# Patient Record
Sex: Male | Born: 1960 | Race: Black or African American | Hispanic: No | Marital: Single | Smoking: Current every day smoker
Health system: Southern US, Community
[De-identification: ages and names within clinical notes are randomized; demographics above are authoritative.]

## PROBLEM LIST (undated history)

## (undated) DIAGNOSIS — Z720 Tobacco use: Secondary | ICD-10-CM

## (undated) DIAGNOSIS — I10 Essential (primary) hypertension: Secondary | ICD-10-CM

## (undated) DIAGNOSIS — F209 Schizophrenia, unspecified: Secondary | ICD-10-CM

## (undated) DIAGNOSIS — B1920 Unspecified viral hepatitis C without hepatic coma: Secondary | ICD-10-CM

## (undated) DIAGNOSIS — F1910 Other psychoactive substance abuse, uncomplicated: Secondary | ICD-10-CM

## (undated) HISTORY — PX: HAND SURGERY: SHX662

## (undated) HISTORY — PX: NOSE SURGERY: SHX723

## (undated) HISTORY — PX: FOOT SURGERY: SHX648

---

## 2011-08-23 ENCOUNTER — Emergency Department (HOSPITAL_COMMUNITY)
Admission: EM | Admit: 2011-08-23 | Discharge: 2011-08-23 | Disposition: A | Discharge status: Home / Self Care | Payer: Self-pay | Attending: Emergency Medicine | Admitting: Emergency Medicine

## 2011-08-23 ENCOUNTER — Emergency Department (HOSPITAL_COMMUNITY): Payer: Self-pay

## 2011-08-23 DIAGNOSIS — G8929 Other chronic pain: Secondary | ICD-10-CM | POA: Insufficient documentation

## 2011-08-23 DIAGNOSIS — S6710XA Crushing injury of unspecified finger(s), initial encounter: Secondary | ICD-10-CM | POA: Insufficient documentation

## 2011-08-23 DIAGNOSIS — I10 Essential (primary) hypertension: Secondary | ICD-10-CM | POA: Insufficient documentation

## 2011-08-23 DIAGNOSIS — F17200 Nicotine dependence, unspecified, uncomplicated: Secondary | ICD-10-CM | POA: Insufficient documentation

## 2011-08-23 DIAGNOSIS — M79609 Pain in unspecified limb: Secondary | ICD-10-CM | POA: Insufficient documentation

## 2011-08-23 DIAGNOSIS — W230XXA Caught, crushed, jammed, or pinched between moving objects, initial encounter: Secondary | ICD-10-CM | POA: Insufficient documentation

## 2011-09-17 ENCOUNTER — Emergency Department (HOSPITAL_BASED_OUTPATIENT_CLINIC_OR_DEPARTMENT_OTHER)
Admission: EM | Admit: 2011-09-17 | Discharge: 2011-09-18 | Disposition: A | Discharge status: Rehabilitation Facility | Payer: Self-pay | Attending: Emergency Medicine | Admitting: Emergency Medicine

## 2011-09-17 ENCOUNTER — Encounter: Payer: Self-pay | Admitting: *Deleted

## 2011-09-17 DIAGNOSIS — R112 Nausea with vomiting, unspecified: Secondary | ICD-10-CM | POA: Insufficient documentation

## 2011-09-17 DIAGNOSIS — F1193 Opioid use, unspecified with withdrawal: Secondary | ICD-10-CM

## 2011-09-17 DIAGNOSIS — F17200 Nicotine dependence, unspecified, uncomplicated: Secondary | ICD-10-CM | POA: Insufficient documentation

## 2011-09-17 DIAGNOSIS — F1120 Opioid dependence, uncomplicated: Secondary | ICD-10-CM | POA: Insufficient documentation

## 2011-09-17 DIAGNOSIS — I10 Essential (primary) hypertension: Secondary | ICD-10-CM | POA: Insufficient documentation

## 2011-09-17 DIAGNOSIS — F19939 Other psychoactive substance use, unspecified with withdrawal, unspecified: Secondary | ICD-10-CM | POA: Insufficient documentation

## 2011-09-17 DIAGNOSIS — F1123 Opioid dependence with withdrawal: Secondary | ICD-10-CM

## 2011-09-17 HISTORY — DX: Other psychoactive substance abuse, uncomplicated: F19.10

## 2011-09-17 HISTORY — DX: Essential (primary) hypertension: I10

## 2011-09-17 LAB — URINALYSIS, ROUTINE W REFLEX MICROSCOPIC
Bilirubin Urine: NEGATIVE
Glucose, UA: NEGATIVE mg/dL
Hgb urine dipstick: NEGATIVE
Ketones, ur: NEGATIVE mg/dL
Nitrite: NEGATIVE
Protein, ur: 30 mg/dL — AB
Specific Gravity, Urine: 1.025 (ref 1.005–1.030)
Urobilinogen, UA: 0.2 mg/dL (ref 0.0–1.0)
pH: 7.5 (ref 5.0–8.0)

## 2011-09-17 LAB — COMPREHENSIVE METABOLIC PANEL
ALT: 61 U/L — ABNORMAL HIGH (ref 0–53)
AST: 50 U/L — ABNORMAL HIGH (ref 0–37)
Albumin: 3.8 g/dL (ref 3.5–5.2)
Alkaline Phosphatase: 77 U/L (ref 39–117)
BUN: 16 mg/dL (ref 6–23)
CO2: 25 mEq/L (ref 19–32)
Calcium: 9.6 mg/dL (ref 8.4–10.5)
Chloride: 106 mEq/L (ref 96–112)
Creatinine, Ser: 0.8 mg/dL (ref 0.50–1.35)
GFR calc Af Amer: >90 mL/min (ref >90)
GFR calc non Af Amer: >90 mL/min (ref >90)
Glucose, Bld: 105 mg/dL — ABNORMAL HIGH (ref 70–99)
Potassium: 3.8 mEq/L (ref 3.5–5.1)
Sodium: 140 mEq/L (ref 135–145)
Total Bilirubin: 0.8 mg/dL (ref 0.3–1.2)
Total Protein: 7.5 g/dL (ref 6.0–8.3)

## 2011-09-17 LAB — URINE MICROSCOPIC-ADD ON

## 2011-09-17 LAB — CBC
HCT: 49.4 % (ref 39.0–52.0)
Hemoglobin: 17.3 g/dL — ABNORMAL HIGH (ref 13.0–17.0)
MCH: 29.6 pg (ref 26.0–34.0)
MCHC: 35 g/dL (ref 30.0–36.0)
MCV: 84.6 fL (ref 78.0–100.0)
Platelets: 211 10*3/uL (ref 150–400)
RBC: 5.84 MIL/uL — ABNORMAL HIGH (ref 4.22–5.81)
RDW: 13.3 % (ref 11.5–15.5)
WBC: 5.9 10*3/uL (ref 4.0–10.5)

## 2011-09-17 LAB — RAPID URINE DRUG SCREEN, HOSP PERFORMED
Amphetamines: NOT DETECTED
Barbiturates: NOT DETECTED
Benzodiazepines: NOT DETECTED
Cocaine: NOT DETECTED
Opiates: POSITIVE — AB
Tetrahydrocannabinol: NOT DETECTED

## 2011-09-17 LAB — ETHANOL: Alcohol, Ethyl (B): <11 mg/dL (ref 0–11)

## 2011-09-17 LAB — ACETAMINOPHEN LEVEL: Acetaminophen (Tylenol), Serum: <15 ug/mL (ref 10–30)

## 2011-09-17 MED ORDER — IBUPROFEN 800 MG PO TABS
800.0000 mg | ORAL_TABLET | Freq: Once | ORAL | Status: AC
  Administered 2011-09-17: 800 mg via ORAL
  Filled 2011-09-17: qty 1

## 2011-09-17 MED ORDER — ONDANSETRON 8 MG PO TBDP
ORAL_TABLET | ORAL | Status: AC
  Filled 2011-09-17: qty 1

## 2011-09-17 MED ORDER — OXYCODONE-ACETAMINOPHEN 5-325 MG PO TABS
2.0000 | ORAL_TABLET | Freq: Once | ORAL | Status: AC
  Administered 2011-09-17: 2 via ORAL
  Filled 2011-09-17: qty 2

## 2011-09-17 MED ORDER — DIAZEPAM 5 MG PO TABS
5.0000 mg | ORAL_TABLET | Freq: Once | ORAL | Status: AC
  Administered 2011-09-17: 5 mg via ORAL
  Filled 2011-09-17: qty 1

## 2011-09-17 MED ORDER — ENALAPRIL MALEATE 10 MG PO TABS
10.0000 mg | ORAL_TABLET | Freq: Once | ORAL | Status: AC
  Administered 2011-09-17: 10 mg via ORAL
  Filled 2011-09-17: qty 1

## 2011-09-17 MED ORDER — ONDANSETRON 8 MG PO TBDP
8.0000 mg | ORAL_TABLET | Freq: Once | ORAL | Status: AC
  Administered 2011-09-17: 8 mg via ORAL

## 2011-09-17 MED ORDER — ONDANSETRON 8 MG PO TBDP
ORAL_TABLET | ORAL | Status: AC
  Administered 2011-09-17: 8 mg via ORAL
  Filled 2011-09-17: qty 1

## 2011-09-17 MED ORDER — CLONIDINE HCL 0.1 MG PO TABS
0.1000 mg | ORAL_TABLET | Freq: Once | ORAL | Status: AC
  Administered 2011-09-17: 0.1 mg via ORAL
  Filled 2011-09-17: qty 1

## 2011-09-17 MED ORDER — ENALAPRIL MALEATE 10 MG PO TABS: 10.0000 mg | ORAL_TABLET | Freq: Every day | ORAL | Status: DC

## 2011-09-17 NOTE — ED Notes (Signed)
Spoke with [place] at [facility] was told that pt needed a 14-30 supply of medication before he "may be accepted" as well as 4 consecutive normal BP over a 4 hour time span. Unable to facilitate transfer at this time [facility] notified to help with placement

## 2011-09-17 NOTE — ED Provider Notes (Signed)
History  Scribed for Raeford Razor, MD, the patient was seen in MH06/MH06. The chart was scribed by Gilman Schmidt. The patients care was started at 3:20 PM. CSN: [id number] Arrival date & time: 09/17/2011  2:10 PM   First MD Initiated Contact with Patient 09/17/11 1453      Chief Complaint  Patient presents with  . Medical Clearance    HPI Frederick [name] is a 50 y.o. male who presents to the Emergency Department complaining of medical clearance. 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 Last use was Thursday morning (two days ago). Pt states he is accepted by [facility] and needs medical clearance. States he has quit before. Denies any thoughts of harming self or others. Denies any other substance abuse. Additionally notes that he has not eaten since Thursday. Associated symptoms of N/V/D, back pain, chills but states that these are typical withdrawal symptoms. Also notes, pain and swelling in normal injection site (hands). Pt states he should have been taking blood pressure meds but is unable to get Rx due to not having a PCP. There are no other associated symptoms and no other alleviating or aggravating factors.     Past Medical History  Diagnosis Date  . Drug abuse   . Hypertension     Past Surgical History  Procedure Date  . Nose surgery   . Hand surgery   . Foot surgery     No family history on file.  History  Substance Use Topics  . Smoking status: Current Everyday Smoker -- 0.5 packs/day  . Smokeless tobacco: Never Used  . Alcohol Use: Yes     last drink 6 months ago      Review of Systems  Constitutional: Negative for appetite change.  HENT: Positive for neck pain.   Gastrointestinal: Positive for nausea, vomiting and diarrhea.  Musculoskeletal: Positive for back pain.  Neurological: Negative for dizziness and headaches.  Psychiatric/Behavioral: Negative for suicidal ideas and self-injury.  All other systems reviewed and are  negative.    Allergies  Review of patient's allergies indicates no known allergies.  Home Medications  No current outpatient prescriptions on file.  BP 182/110  Pulse 72  Temp(Src) 97.9 F (36.6 C) (Oral)  Resp 18  Ht 6\' 2"  (1.88 m)  Wt 200 lb (90.719 kg)  BMI 25.68 kg/m2  SpO2 99%  Physical Exam  Constitutional: He is oriented to person, place, and time. He appears well-developed and well-nourished.  Non-toxic appearance. He does not have a sickly appearance.  HENT:  Head: Normocephalic and atraumatic.  Eyes: Conjunctivae, EOM and lids are normal. Pupils are equal, round, and reactive to light.  Neck: Trachea normal, normal range of motion and full passive range of motion without pain. Neck supple.  Cardiovascular: Regular rhythm and normal heart sounds.   Pulmonary/Chest: Effort normal and breath sounds normal. No respiratory distress.  Abdominal: Soft. Normal appearance. He exhibits no distension. There is no tenderness. There is no rebound and no CVA tenderness.  Musculoskeletal: Normal range of motion.  Neurological: He is alert and oriented to person, place, and time. He has normal strength. No cranial nerve deficit. Coordination normal.  Skin: Skin is warm, dry and intact. No rash noted.    ED Course  Procedures  DIAGNOSTIC STUDIES: Oxygen Saturation is 99% on room air, normal by my interpretation.    COORDINATION OF CARE: 3:20:  - Patient evaluated by ED physician, Advil, Valium, Catapres, Zofran, labs ordered  Results for orders placed during the hospital encounter of 09/17/11  URINALYSIS, ROUTINE W REFLEX MICROSCOPIC      Component Value Range   Color, Urine AMBER (*) YELLOW    Appearance CLEAR  CLEAR    Specific Gravity, Urine 1.025  1.005 - 1.030    pH 7.5  5.0 - 8.0    Glucose, UA NEGATIVE  NEGATIVE (mg/dL)   Hgb urine dipstick NEGATIVE  NEGATIVE    Bilirubin Urine NEGATIVE  NEGATIVE    Ketones, ur NEGATIVE  NEGATIVE (mg/dL)   Protein, ur 30 (*)  NEGATIVE (mg/dL)   Urobilinogen, UA 0.2  0.0 - 1.0 (mg/dL)   Nitrite NEGATIVE  NEGATIVE    Leukocytes, UA TRACE (*) NEGATIVE   URINE RAPID DRUG SCREEN (HOSP PERFORMED)      Component Value Range   Opiates POSITIVE (*) NONE DETECTED    Cocaine NONE DETECTED  NONE DETECTED    Benzodiazepines NONE DETECTED  NONE DETECTED    Amphetamines NONE DETECTED  NONE DETECTED    Tetrahydrocannabinol NONE DETECTED  NONE DETECTED    Barbiturates NONE DETECTED  NONE DETECTED   CBC      Component Value Range   WBC 5.9  4.0 - 10.5 (K/uL)   RBC 5.84 (*) 4.22 - 5.81 (MIL/uL)   Hemoglobin 17.3 (*) 13.0 - 17.0 (g/dL)   HCT 45.4  09.8 - 11.9 (%)   MCV 84.6  78.0 - 100.0 (fL)   MCH 29.6  26.0 - 34.0 (pg)   MCHC 35.0  30.0 - 36.0 (g/dL)   RDW 14.7  82.9 - 56.2 (%)   Platelets 211  150 - 400 (K/uL)  COMPREHENSIVE METABOLIC PANEL      Component Value Range   Sodium 140  135 - 145 (mEq/L)   Potassium 3.8  3.5 - 5.1 (mEq/L)   Chloride 106  96 - 112 (mEq/L)   CO2 25  19 - 32 (mEq/L)   Glucose, Bld 105 (*) 70 - 99 (mg/dL)   BUN 16  6 - 23 (mg/dL)   Creatinine, Ser 1.30  0.50 - 1.35 (mg/dL)   Calcium 9.6  8.4 - 86.5 (mg/dL)   Total Protein 7.5  6.0 - 8.3 (g/dL)   Albumin 3.8  3.5 - 5.2 (g/dL)   AST 50 (*) 0 - 37 (U/L)   ALT 61 (*) 0 - 53 (U/L)   Alkaline Phosphatase 77  39 - 117 (U/L)   Total Bilirubin 0.8  0.3 - 1.2 (mg/dL)   GFR calc non Af Amer >90  >90 (mL/min)   GFR calc Af Amer >90  >90 (mL/min)  ETHANOL      Component Value Range   Alcohol, Ethyl (B) <11  0 - 11 (mg/dL)  ACETAMINOPHEN LEVEL      Component Value Range   Acetaminophen (Tylenol), Serum <15.0  10 - 30 (ug/mL)  URINE MICROSCOPIC-ADD ON      Component Value Range   Squamous Epithelial / LPF RARE  RARE    WBC, UA 3-6  <3 (WBC/hpf)   RBC / HPF 3-6  <3 (RBC/hpf)   Bacteria, UA MANY (*) RARE    Urine-Other MUCOUS PRESENT          MDM  50yM with heroin withdrawal. Last used 2d ago. Attributes symptoms to withdrawal and  similar to previous. Nontoxic.  Pt with place at [facility] but were requesting clinical clearance prior. Pt medically cleared from my perspective. [facility] requesting better BP control. Restarted on vasotec which  pt has been previously prescribed. Suspect HTN from poor compliance with meds and likely component of withdrawal. PRN clonidine as well. No SI/HI or psychosis.  I personally preformed the services scribed in my presence. The recorded information has been reviewed and considered. Raeford Razor, MD.        Raeford Razor, MD 09/18/11 [phone]

## 2011-09-17 NOTE — ED Notes (Signed)
[facility] here to assess pt

## 2011-09-17 NOTE — ED Notes (Signed)
Pt states he is accepted by [facility]  For detox- needs medical clearance and they will pick him up- denies SI/HI

## 2011-09-17 NOTE — ED Notes (Signed)
Pt reports he is detoxing from heroin- last use Thursday am

## 2011-09-17 NOTE — ED Notes (Signed)
Pt requesting PO fluids. Pt given gingerale , delay explained regarding [facility] , will continue to monitor

## 2011-09-17 NOTE — ED Notes (Signed)
Patient given ginger ale and crackers with cheese. He is resting comfortably with no additiona complaints. Patient instructed to call for further needs.

## 2011-09-17 NOTE — ED Notes (Signed)
[facility] representative will fill a 15 day supply of medication for pt. RX called in to preferred pharmacy   Awaiting their arrival for assessment

## 2011-09-17 NOTE — ED Notes (Signed)
Spoke with [facility] they are coming to assess pt and find a suitable program for pt to enter

## 2011-09-17 NOTE — ED Notes (Signed)
Unable to obtain blood for testing. Assigned RN made aware

## 2011-12-04 ENCOUNTER — Encounter (HOSPITAL_BASED_OUTPATIENT_CLINIC_OR_DEPARTMENT_OTHER): Payer: Self-pay | Admitting: *Deleted

## 2011-12-04 ENCOUNTER — Emergency Department (HOSPITAL_BASED_OUTPATIENT_CLINIC_OR_DEPARTMENT_OTHER)
Admission: EM | Admit: 2011-12-04 | Discharge: 2011-12-04 | Disposition: A | Discharge status: Home / Self Care | Payer: Self-pay | Attending: Emergency Medicine | Admitting: Emergency Medicine

## 2011-12-04 DIAGNOSIS — L03319 Cellulitis of trunk, unspecified: Secondary | ICD-10-CM | POA: Insufficient documentation

## 2011-12-04 DIAGNOSIS — IMO0002 Reserved for concepts with insufficient information to code with codable children: Secondary | ICD-10-CM

## 2011-12-04 DIAGNOSIS — L02219 Cutaneous abscess of trunk, unspecified: Secondary | ICD-10-CM | POA: Insufficient documentation

## 2011-12-04 MED ORDER — CLINDAMYCIN HCL 300 MG PO CAPS: 300.0000 mg | ORAL_CAPSULE | Freq: Three times a day (TID) | ORAL | Status: AC

## 2011-12-04 NOTE — ED Notes (Signed)
Pt states he has had a place on his abd for 3-4 weeks. "Tried home remedy, drained green drainage". Now feels sore and tight again.

## 2011-12-04 NOTE — ED Provider Notes (Signed)
Medical screening examination/treatment/procedure(s) were performed by non-physician practitioner and as supervising physician I was immediately available for consultation/collaboration.   Nat Christen, MD 12/04/11 2139

## 2011-12-04 NOTE — ED Provider Notes (Signed)
History     CSN: [id number]  Arrival date & time 12/04/11  1858   First MD Initiated Contact with Patient 12/04/11 1915      Chief Complaint  Patient presents with  . Abscess    (Consider location/radiation/quality/duration/timing/severity/associated sxs/prior treatment) HPI Comments: Pt states that he has tried home remedies, and it started to drain, but it doesn't seem to be getting any better  Patient is a 51 y.o. male presenting with abscess. The history is provided by the patient. No language interpreter was used.  Abscess  This is a new problem. The current episode started more than one week ago. The abscess is present on the abdomen. The abscess is characterized by redness, painfulness and draining. Pertinent negatives include no fever. There were no sick contacts. He has received no recent medical care.    Past Medical History  Diagnosis Date  . Drug abuse   . Hypertension     Past Surgical History  Procedure Date  . Nose surgery   . Hand surgery   . Foot surgery     History reviewed. No pertinent family history.  History  Substance Use Topics  . Smoking status: Current Everyday Smoker -- 0.5 packs/day  . Smokeless tobacco: Never Used  . Alcohol Use: Yes     last drink 6 months ago      Review of Systems  Constitutional: Negative for fever.  All other systems reviewed and are negative.    Allergies  Review of patient's allergies indicates no known allergies.  Home Medications  No current outpatient prescriptions on file.  BP 171/102  Pulse 85  Temp(Src) 98.6 F (37 C) (Oral)  Resp 19  Ht 6\' 2"  (1.88 m)  Wt 220 lb (99.791 kg)  BMI 28.25 kg/m2  SpO2 98%  Physical Exam  Nursing note and vitals reviewed. Constitutional: He is oriented to person, place, and time. He appears well-developed and well-nourished.  HENT:  Head: Normocephalic and atraumatic.  Cardiovascular: Normal rate and regular rhythm.   Pulmonary/Chest: Effort normal and  breath sounds normal.  Abdominal: Soft.  Musculoskeletal: Normal range of motion.  Neurological: He is alert and oriented to person, place, and time.  Skin:       Pt has red area to the right lower abdomen with firm area noted to the center with some yellow drainage noted    ED Course  INCISION AND DRAINAGE Performed by: Teressa Lower Authorized by: Teressa Lower Consent: Verbal consent obtained. Written consent not obtained. Risks and benefits: risks, benefits and alternatives were discussed Consent given by: patient Patient understanding: patient states understanding of the procedure being performed Patient identity confirmed: verbally with patient Time out: Immediately prior to procedure a "time out" was called to verify the correct patient, procedure, equipment, support staff and site/side marked as required. Type: abscess Body area: trunk Location details: abdomen Anesthesia: local infiltration Local anesthetic: lidocaine 2% with epinephrine Scalpel size: 11 Incision type: single straight Drainage: purulent Drainage amount: scant Packing material: 1/4 in iodoform gauze Patient tolerance: Patient tolerated the procedure well with no immediate complications.   (including critical care time)  Labs Reviewed - No data to display No results found.   1. Abscess, abdomen       MDM  Wound opened, pt treated for abscess with some cellulitis component;discussed bp with pt        Teressa Lower, NP 12/04/11 2025  Teressa Lower, NP 12/04/11 2025

## 2011-12-04 NOTE — ED Notes (Signed)
I cleaned area around drainage incision and advised patient on keeping dressing clean and dry. I applied 2x2's and covered with large tegaderm film.

## 2014-03-19 ENCOUNTER — Encounter: Payer: Self-pay | Admitting: Family Medicine

## 2014-03-19 ENCOUNTER — Telehealth: Payer: Self-pay | Admitting: Family Medicine

## 2014-03-19 NOTE — Telephone Encounter (Signed)
We received a letter from pt wanting us to review his medical records [facility].  Dr. Susann GivensLalonde reviewed the letter and advised pt needs to have his lawyer handle this through their medical experts.  Letter sent to patient.

## 2015-09-19 ENCOUNTER — Emergency Department (HOSPITAL_COMMUNITY)
Admission: EM | Admit: 2015-09-19 | Discharge: 2015-09-20 | Disposition: A | Discharge status: Home / Self Care | Payer: Self-pay | Attending: Emergency Medicine | Admitting: Emergency Medicine

## 2015-09-19 ENCOUNTER — Encounter (HOSPITAL_COMMUNITY): Payer: Self-pay | Admitting: *Deleted

## 2015-09-19 DIAGNOSIS — F1414 Cocaine abuse with cocaine-induced mood disorder: Secondary | ICD-10-CM | POA: Insufficient documentation

## 2015-09-19 DIAGNOSIS — I10 Essential (primary) hypertension: Secondary | ICD-10-CM | POA: Insufficient documentation

## 2015-09-19 DIAGNOSIS — Z79899 Other long term (current) drug therapy: Secondary | ICD-10-CM | POA: Insufficient documentation

## 2015-09-19 DIAGNOSIS — R440 Auditory hallucinations: Secondary | ICD-10-CM | POA: Insufficient documentation

## 2015-09-19 DIAGNOSIS — Z720 Tobacco use: Secondary | ICD-10-CM | POA: Insufficient documentation

## 2015-09-19 DIAGNOSIS — F1114 Opioid abuse with opioid-induced mood disorder: Secondary | ICD-10-CM | POA: Insufficient documentation

## 2015-09-19 DIAGNOSIS — R441 Visual hallucinations: Secondary | ICD-10-CM | POA: Insufficient documentation

## 2015-09-19 DIAGNOSIS — B1920 Unspecified viral hepatitis C without hepatic coma: Secondary | ICD-10-CM | POA: Clinically undetermined

## 2015-09-19 DIAGNOSIS — R45851 Suicidal ideations: Secondary | ICD-10-CM | POA: Insufficient documentation

## 2015-09-19 DIAGNOSIS — F1994 Other psychoactive substance use, unspecified with psychoactive substance-induced mood disorder: Secondary | ICD-10-CM | POA: Diagnosis present

## 2015-09-19 HISTORY — DX: Schizophrenia, unspecified: F20.9

## 2015-09-19 LAB — COMPREHENSIVE METABOLIC PANEL
ALBUMIN: 4.4 g/dL (ref 3.5–5.0)
ALK PHOS: 85 U/L (ref 38–126)
ALT: 33 U/L (ref 17–63)
AST: 44 U/L — ABNORMAL HIGH (ref 15–41)
Anion gap: 8 (ref 5–15)
BILIRUBIN TOTAL: 1.6 mg/dL — AB (ref 0.3–1.2)
BUN: 19 mg/dL (ref 6–20)
CALCIUM: 9.2 mg/dL (ref 8.9–10.3)
CO2: 27 mmol/L (ref 22–32)
CREATININE: 1.18 mg/dL (ref 0.61–1.24)
Chloride: 105 mmol/L (ref 101–111)
GFR calc Af Amer: >60 mL/min (ref >60)
GFR calc non Af Amer: >60 mL/min (ref >60)
GLUCOSE: 99 mg/dL (ref 65–99)
Potassium: 4.1 mmol/L (ref 3.5–5.1)
SODIUM: 140 mmol/L (ref 135–145)
Total Protein: 7.8 g/dL (ref 6.5–8.1)

## 2015-09-19 LAB — CBC
HEMATOCRIT: 48.1 % (ref 39.0–52.0)
HEMOGLOBIN: 16.8 g/dL (ref 13.0–17.0)
MCH: 31.2 pg (ref 26.0–34.0)
MCHC: 34.9 g/dL (ref 30.0–36.0)
MCV: 89.4 fL (ref 78.0–100.0)
Platelets: 165 10*3/uL (ref 150–400)
RBC: 5.38 MIL/uL (ref 4.22–5.81)
RDW: 12.6 % (ref 11.5–15.5)
WBC: 7.6 10*3/uL (ref 4.0–10.5)

## 2015-09-19 LAB — ACETAMINOPHEN LEVEL: Acetaminophen (Tylenol), Serum: <10 ug/mL — ABNORMAL LOW (ref 10–30)

## 2015-09-19 LAB — RAPID URINE DRUG SCREEN, HOSP PERFORMED
Amphetamines: NOT DETECTED
BARBITURATES: NOT DETECTED
Benzodiazepines: NOT DETECTED
COCAINE: POSITIVE — AB
Opiates: POSITIVE — AB
TETRAHYDROCANNABINOL: NOT DETECTED

## 2015-09-19 LAB — ETHANOL: Alcohol, Ethyl (B): <5 mg/dL (ref <5)

## 2015-09-19 LAB — SALICYLATE LEVEL: Salicylate Lvl: <4 mg/dL (ref 2.8–30.0)

## 2015-09-19 MED ORDER — GABAPENTIN 400 MG PO CAPS
1200.0000 mg | ORAL_CAPSULE | Freq: Three times a day (TID) | ORAL | Status: DC
  Administered 2015-09-19 – 2015-09-20 (×3): 1200 mg via ORAL
  Filled 2015-09-19 (×3): qty 3

## 2015-09-19 MED ORDER — DICYCLOMINE HCL 20 MG PO TABS: 20.0000 mg | ORAL_TABLET | Freq: Four times a day (QID) | ORAL | Status: DC | PRN

## 2015-09-19 MED ORDER — METHOCARBAMOL 500 MG PO TABS: 500.0000 mg | ORAL_TABLET | Freq: Three times a day (TID) | ORAL | Status: DC | PRN

## 2015-09-19 MED ORDER — LORAZEPAM 1 MG PO TABS
1.0000 mg | ORAL_TABLET | Freq: Three times a day (TID) | ORAL | Status: DC | PRN
  Administered 2015-09-19: 1 mg via ORAL
  Filled 2015-09-19: qty 1

## 2015-09-19 MED ORDER — CLONIDINE HCL 0.1 MG PO TABS
0.1000 mg | ORAL_TABLET | Freq: Four times a day (QID) | ORAL | Status: DC
  Administered 2015-09-20: 0.1 mg via ORAL
  Filled 2015-09-19 (×2): qty 1

## 2015-09-19 MED ORDER — LOPERAMIDE HCL 2 MG PO CAPS: 2.0000 mg | ORAL_CAPSULE | ORAL | Status: DC | PRN

## 2015-09-19 MED ORDER — ACETAMINOPHEN 325 MG PO TABS: 650.0000 mg | ORAL_TABLET | ORAL | Status: DC | PRN

## 2015-09-19 MED ORDER — NICOTINE 21 MG/24HR TD PT24: 21.0000 mg | MEDICATED_PATCH | Freq: Every day | TRANSDERMAL | Status: DC | PRN

## 2015-09-19 MED ORDER — CLONIDINE HCL 0.1 MG PO TABS: 0.1000 mg | ORAL_TABLET | Freq: Every day | ORAL | Status: DC

## 2015-09-19 MED ORDER — ENALAPRIL MALEATE 20 MG PO TABS
20.0000 mg | ORAL_TABLET | Freq: Every day | ORAL | Status: DC
  Administered 2015-09-19 – 2015-09-20 (×2): 20 mg via ORAL
  Filled 2015-09-19 (×2): qty 1

## 2015-09-19 MED ORDER — CLONIDINE HCL 0.1 MG PO TABS: 0.1000 mg | ORAL_TABLET | ORAL | Status: DC

## 2015-09-19 MED ORDER — AMLODIPINE BESYLATE 10 MG PO TABS
10.0000 mg | ORAL_TABLET | Freq: Every day | ORAL | Status: DC
  Administered 2015-09-19 – 2015-09-20 (×2): 10 mg via ORAL
  Filled 2015-09-19 (×2): qty 1

## 2015-09-19 MED ORDER — HYDROXYZINE HCL 25 MG PO TABS: 25.0000 mg | ORAL_TABLET | Freq: Four times a day (QID) | ORAL | Status: DC | PRN

## 2015-09-19 MED ORDER — ONDANSETRON 4 MG PO TBDP: 4.0000 mg | ORAL_TABLET | Freq: Four times a day (QID) | ORAL | Status: DC | PRN

## 2015-09-19 MED ORDER — NAPROXEN 500 MG PO TABS: 500.0000 mg | ORAL_TABLET | Freq: Two times a day (BID) | ORAL | Status: DC | PRN

## 2015-09-19 MED ORDER — HYDROCHLOROTHIAZIDE 25 MG PO TABS
25.0000 mg | ORAL_TABLET | Freq: Every day | ORAL | Status: DC
  Administered 2015-09-19: 25 mg via ORAL
  Filled 2015-09-19 (×2): qty 1

## 2015-09-19 NOTE — ED Notes (Signed)
[employer] told rn that pt keeps putting his hand down his pants, rn went in and told pt he was not allowed to have his hands in his pants while pharmacy is talking to him. Pt said he was scratching right right leg above his knee. Pt pulled his pant leg all the way up and exposed his hand, rn told pt take your hand out of your pants and scratch you leg with your pant leg pulled up.

## 2015-09-19 NOTE — BH Assessment (Signed)
Tele Assessment Note   Frederick [name] is an 54 y.o. male. Per chart review, pt BIB police after police called b/c pt was "jumping in and out of traffic".  Pt is drowsy and falls asleep several times during assessment. Pt denies he was walking into traffic today in a suicide attempt. He reports that he was chatting with the police and talking to the police about the need to vocational rehab. Pt says that he does well for a while with his life and then starts abusing heroin again. [facility]. Pt denies prior suicide attempts. Pt reports hx of schizophrenia. He reports he uses heroin and used 3 grams this am. Pt doesn't respond when asked how he ingests the heroin. [facility]. 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   Diagnosis:  Opioid Use Disorder, Severe Schizophrenia    Past Medical History:  Past Medical History  Diagnosis Date  . Drug abuse     heroin  . Hypertension   . Schizophrenia [facility](HCC)     Past Surgical History  Procedure Laterality Date  . Nose surgery    . Hand surgery    . Foot surgery      Family History: History reviewed. No pertinent family history.  Social History:  reports that he has been smoking.  He has never used smokeless tobacco. He reports that he drinks alcohol. He reports that he uses illicit drugs.  Additional Social History:  Alcohol / Drug Use Pain Medications: pt denies abuse - see PTA meds list Prescriptions: pt denies abuse = see PTA meds list Over the Counter: pt denies abuse - see PTA meds list History of alcohol / drug use?: Yes Substance #1 Name of Substance 1: heroin 1 - Last Use / Amount: 09/19/15 - 3 grams  CIWA: CIWA-Ar BP: 129/81 mmHg Pulse Rate: 75 COWS:    PATIENT STRENGTHS: (choose at least two) Average or above average intelligence Communication skills  Allergies: No Known Allergies  Home Medications:   (Not in a hospital admission)  OB/GYN Status:  No LMP for male patient.  General Assessment Data Location of Assessment: [facility] TTS Assessment: In system Is this a Tele or Face-to-Face Assessment?: Face-to-Face Is this an Initial Assessment or a Re-assessment for this encounter?: Initial Assessment Living Arrangements: Other (Comment) (homeless, on the streets) Can pt return to current living arrangement?: Yes Admission Status: Voluntary Is patient capable of signing voluntary admission?: Yes Referral Source: Self/Family/Friend Insurance type: self pay     Crisis Care Plan Living Arrangements: Other (Comment) (homeless, on the streets) Name of Psychiatrist: none Name of Therapist: none  Education Status Is patient currently in school?: No Highest grade of school patient has completed: 13  Risk to self with the past 6 months Suicidal Ideation: No Has patient been a risk to self within the past 6 months prior to admission? : No Suicidal Intent: No Has patient had any suicidal intent within the past 6 months prior to admission? : No Is patient at risk for suicide?: Yes Suicidal Plan?: No Has patient had any suicidal plan within the past 6 months prior to admission? : No Access to Means: Yes Specify Access to Suicidal Means: na What has been your use of drugs/alcohol within the last 12 months?: pt reports heroin abuse Previous Attempts/Gestures: No How many times?: 0 (pt denies he tried to walk into traffic) Other Self  Harm Risks: none Triggers for Past Attempts:  (n/a) Intentional Self Injurious Behavior: None Family Suicide History: Unknown Recent stressful life event(s): Other (Comment) (heroin abuse) Persecutory voices/beliefs?: No Depression: No Depression Symptoms:  (none) Substance abuse history and/or treatment for substance abuse?: Yes Suicide prevention information given to non-admitted patients: Not applicable  Risk to Others within the past 6  months Homicidal Ideation: No Does patient have any lifetime risk of violence toward others beyond the six months prior to admission? : No Thoughts of Harm to Others: No Current Homicidal Intent: No Current Homicidal Plan: No Access to Homicidal Means: No Identified Victim: none History of harm to others?: No Assessment of Violence: None Noted Violent Behavior Description: pt denies hx of abuse Does patient have access to weapons?: No Criminal Charges Pending?: Yes Describe Pending Criminal Charges: reckless driving to endanger Does patient have a court date: Yes Court Date: 09/29/15 Is patient on probation?: Unknown  Psychosis Hallucinations: Auditory, Visual Delusions: None noted  Mental Status Report Appearance/Hygiene: In scrubs, Disheveled Eye Contact: Fair Motor Activity: Freedom of movement, Psychomotor retardation Speech: Logical/coherent, Slow Level of Consciousness: Drowsy, Sleeping Mood: Euthymic Affect: Other (Comment) (sleepy) Anxiety Level: None Thought Processes: Relevant, Coherent Judgement: Impaired Orientation: Person, Place, Time Obsessive Compulsive Thoughts/Behaviors: None  Cognitive Functioning Concentration: Normal Memory: Recent Intact, Remote Intact IQ: Average Insight: Fair Impulse Control: Poor Appetite: Fair Sleep: Decreased Total Hours of Sleep: 2 Vegetative Symptoms: None  ADLScreening [facility] Assessment Services) Patient's cognitive ability adequate to safely complete daily activities?: Yes Patient able to express need for assistance with ADLs?: Yes Independently performs ADLs?: Yes (appropriate for developmental age)  Prior Inpatient Therapy Prior Inpatient Therapy: Yes Prior Therapy Dates: over the past few years Prior Therapy Facilty/Provider(s): [facility] Reason for Treatment: substance abuse  Prior Outpatient Therapy Prior Outpatient Therapy: Yes Does patient have an ACCT team?: No Does patient have Intensive  In-House Services?  : No  ADL Screening (condition at time of admission) Patient's cognitive ability adequate to safely complete daily activities?: Yes Is the patient deaf or have difficulty hearing?: No Does the patient have difficulty seeing, even when wearing glasses/contacts?: No Does the patient have difficulty concentrating, remembering, or making decisions?: No Patient able to express need for assistance with ADLs?: Yes Does the patient have difficulty dressing or bathing?: No Independently performs ADLs?: Yes (appropriate for developmental age) Does the patient have difficulty walking or climbing stairs?: No Weakness of Legs: None Weakness of Arms/Hands: None  Home Assistive Devices/Equipment Home Assistive Devices/Equipment: None    Abuse/Neglect Assessment (Assessment to be complete while patient is alone) Physical Abuse: Denies Verbal Abuse: Denies Sexual Abuse: Denies Exploitation of patient/patient's resources: Denies Self-Neglect: Denies     Merchant navy officer (For Healthcare) Does patient have an advance directive?: No    Additional Information 1:1 In Past 12 Months?: No CIRT Risk: No Elopement Risk: No Does patient have medical clearance?: No     Disposition:   Alberteen Sam NP recommends pt be kept in ED overnight to observation with psych evaluation 10/29 am.  Disposition Initial Assessment Completed for this Encounter: Yes Disposition of Patient: Other dispositions Other disposition(s):  (observe overnight and psych consult )  Aneesa Romey P 09/19/2015 4:34 PM

## 2015-09-19 NOTE — ED Notes (Signed)
Pt extremely slow to change clothes, pt just sitting in bathroom when rn checked on him. rn had to coach pt through changing his clothes. Pt has calluses/wounds to bottom on feet.

## 2015-09-19 NOTE — ED Notes (Signed)
Pt AAO x 3, no distress noted, watching TV at present.  Monitoring for safety, Q 15 min checks in effect.

## 2015-09-19 NOTE — ED Notes (Signed)
Bed: WBH41 Expected date:  Expected time:  Means of arrival:  Comments: Triage 4 

## 2015-09-19 NOTE — ED Notes (Signed)
Per magistrate IVC papers were not received even though we received confirmation of fax

## 2015-09-19 NOTE — ED Notes (Signed)
After collecting pt's blood, pt starting making weird noises and put his hand in his scrub pants and started what appeared to be masturbating.  I notified edp and triage nurse.

## 2015-09-19 NOTE — ED Provider Notes (Signed)
Informed by  Nursing that patient wanted to leave. Psychiatry team had already seen him and recommended admission so patient will be IVC'ed to ensure he receives care that is appropriate.   Marily MemosJason Tempest Frankland, MD 09/19/15 2009
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
## 2015-09-19 NOTE — Progress Notes (Signed)
Pt is a 54 y/o AAM admitted to [facility] with h/o Schizophrenia and medication noncompliance X months in conjunction with daily heroin use per nursing report. [facility] reportedly "walking in and out of traffic" in a suicide attempt which he denied. Pt ambulated to unit with assigned ED tech and gait was unsteady at the time. Pt was sedated on initial contact, could not stay awake to complete assessment, required multiple prompts in between questions. Pt denied SI, HI, AVH and pain at time of assessment. However, he did stated to writer that he hears voices sometimes but "not right now". Pt slept, woke up and is much clearer in his speech with steady gait compared to initial presentation. Pt was started on CIWA with Clonidine Protocol but refused initial dose when offered by writer and is demanding d/c at present. Per pt, "I just told the police I wanted to talk to someone, I was not trying to kill myself, now I've missed home coming, my job and the kept pushing me further and further into the system, now I want to leave".  Assigned NP Drenda FreezeFran made aware of pt's request to be d/c. Staff continues to offer support and encouragement. Q15 minutes checks maintained for safety as ordered without outburst or self injurious behavior to note at this time.

## 2015-09-19 NOTE — ED Notes (Signed)
Delay in lab draw, pt in bathroom 
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
## 2015-09-20 DIAGNOSIS — F1994 Other psychoactive substance use, unspecified with psychoactive substance-induced mood disorder: Secondary | ICD-10-CM | POA: Diagnosis present

## 2015-09-20 NOTE — Discharge Instructions (Signed)
Stress and Stress Management °Stress is a normal reaction to life events. It is what you feel when life demands more than you are used to or more than you can handle. Some stress can be useful. For example, the stress reaction can help you catch the last bus of the day, study for a test, or meet a deadline at work. But stress that occurs too often or for too long can cause problems. It can affect your emotional health and interfere with relationships and normal daily activities. Too much stress can weaken your immune system and increase your risk for physical illness. If you already have a medical problem, stress can make it worse. °CAUSES  °All sorts of life events may cause stress. An event that causes stress for one person may not be stressful for another person. Major life events commonly cause stress. These may be positive or negative. Examples include losing your job, moving into a new home, getting married, having a baby, or losing a loved one. Less obvious life events may also cause stress, especially if they occur day after day or in combination. Examples include working long hours, driving in traffic, caring for children, being in debt, or being in a difficult relationship. °SIGNS AND SYMPTOMS °Stress may cause emotional symptoms including, the following: °· Anxiety. This is feeling worried, afraid, on edge, overwhelmed, or out of control. °· Anger. This is feeling irritated or impatient. °· Depression. This is feeling sad, down, helpless, or guilty. °· Difficulty focusing, remembering, or making decisions. °Stress may cause physical symptoms, including the following:  °· Aches and pains. These may affect your head, neck, back, stomach, or other areas of your body. °· Tight muscles or clenched jaw. °· Low energy or trouble sleeping.  °Stress may cause unhealthy behaviors, including the following:  °· Eating to feel better (overeating) or skipping meals. °· Sleeping too little, too much, or both. °· Working  too much or putting off tasks (procrastination). °· Smoking, drinking alcohol, or using drugs to feel better. °DIAGNOSIS  °Stress is diagnosed through an assessment by your health care provider. Your health care provider will ask questions about your symptoms and any stressful life events. Your health care provider will also ask about your medical history and may order blood tests or other tests. Certain medical conditions and medicine can cause physical symptoms similar to stress.  Mental illness can cause emotional symptoms and unhealthy behaviors similar to stress. Your health care provider may refer you to a mental health professional for further evaluation.  °TREATMENT  °Stress management is the recommended treatment for stress. The goals of stress management are reducing stressful life events and coping with stress in healthy ways.  °Techniques for reducing stressful life events include the following: °· Stress identification. Self-monitor for stress and identify what causes stress for you. These skills may help you to avoid some stressful events. °· Time management. Set your priorities, keep a calendar of events, and learn to say "no." These tools can help you avoid making too many commitments. °Techniques for coping with stress include the following: °· Rethinking the problem. Try to think realistically about stressful events rather than ignoring them or overreacting. Try to find the positives in a stressful situation rather than focusing on the negatives. °· Exercise. Physical exercise can release both physical and emotional tension. The key is to find a form of exercise you enjoy and do it regularly. °· Relaxation techniques. These relax the body and mind. Examples include yoga, meditation, tai chi, biofeedback, deep   breathing, progressive muscle relaxation, listening to music, being out in nature, journaling, and other hobbies. Again, the key is to find one or more that you enjoy and can do  regularly.  Healthy lifestyle. Eat a balanced diet, get plenty of sleep, and do not smoke. Avoid using alcohol or drugs to relax.  Strong support network. Spend time with family, friends, or other people you enjoy being around.Express your feelings and talk things over with someone you trust. Counseling or talktherapy with a mental health professional may be helpful if you are having difficulty managing stress on your own. Medicine is typically not recommended for the treatment of stress.Talk to your health care provider if you think you need medicine for symptoms of stress. HOME CARE INSTRUCTIONS  Keep all follow-up visits as directed by your health care provider.  Take all medicines as directed by your health care provider. SEEK MEDICAL CARE IF:  Your symptoms get worse or you start having new symptoms.  You feel overwhelmed by your problems and can no longer manage them on your own. SEEK IMMEDIATE MEDICAL CARE IF:  You feel like hurting yourself or someone else.   This information is not intended to replace advice given to you by your health care provider. Make sure you discuss any questions you have with your health care provider.   Document Released: 05/03/2001 Document Revised: 11/28/2014 Document Reviewed: 07/02/2013 Elsevier Interactive Patient Education [employer].

## 2015-09-20 NOTE — Progress Notes (Signed)
11:55am. [facility] rescinds pt's IVC. CSW filed paperwork and faxed to [facility]. RN aware.  York SpanielAlexandra Mariacristina Aday [facility] Dba GenerationsCSWA Clinical Social Worker [facility] phone: [phone]

## 2015-09-20 NOTE — [facility] Suicide Risk Assessment (Signed)
Suicide Risk Assessment  Discharge Assessment   [facility]   Demographic Factors:  Male  Total Time spent with patient: 20 minutes  Musculoskeletal: Strength & Muscle Tone: within normal limits Gait & Station: normal Patient leans: N/A  Psychiatric Specialty Exam:     Blood pressure 122/81, pulse 86, temperature 98.5 F (36.9 C), temperature source Oral, resp. rate 16, SpO2 96 %.There is no weight on file to calculate BMI.   General Appearance: Fairly Groomed  Patent attorneyye Contact::  Good  Speech:  Clear and Coherent and Normal Rate  Volume:  Increased  Mood:  Angry  Affect:  Congruent  Thought Process:  Coherent and Goal Directed  Orientation:  Full (Time, Place, and Person)  Thought Content:  WDL  Suicidal Thoughts:  No  Homicidal Thoughts:  No  Memory:  Immediate;   Fair Recent;   Fair Remote;   Fair  Judgement:  Intact  Insight:  Fair  Psychomotor Activity:  Restlessness  Concentration:  Fair  Recall:  FiservFair  Fund of Knowledge:Fair  Language: Fair  Akathisia:  No  Handed:  Right  AIMS (if indicated):     Assets:  Communication Skills Desire for Improvement Housing Physical Health Resilience  ADL's:  Intact  Cognition: WNL  Sleep:         Has this patient used any form of tobacco in the last 30 days? (Cigarettes, Smokeless Tobacco, Cigars, and/or Pipes) Yes, Prescription not provided because: refused medication for smoking cessation  Mental Status Per Nursing Assessment::   On Admission:     Current Mental Status by Physician: NA  Loss Factors: Legal issues  Historical Factors: NA  Risk Reduction Factors:   NA  Continued Clinical Symptoms:  Alcohol/Substance Abuse/Dependencies  Cognitive Features That Contribute To Risk:  Closed-mindedness    Suicide Risk:  Minimal: No identifiable suicidal ideation.  Patients presenting with no risk factors but with morbid ruminations; may be classified as minimal risk based on the  severity of the depressive symptoms  Principal Problem: Substance induced mood disorder [facility](HCC) Discharge Diagnoses:  Patient Active Problem List   Diagnosis Date Noted  . Substance induced mood disorder [facility](HCC) [F19.94] 09/20/2015      Plan Of Care/Follow-up recommendations:  Activity:  As tolerated Diet:  Regular Tests:  As determined by PCP Other:  Follow up with outpatient resources provided for substance abuse  Is patient on multiple antipsychotic therapies at discharge:  No   Has Patient had three or more failed trials of antipsychotic monotherapy by history:  No  Recommended Plan for Multiple Antipsychotic Therapies: NA    Alberteen SamFran Connor Foxworthy, FNP-BC [facility] 09/20/2015, 11:24 AM
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
## 2015-09-20 NOTE — ED Notes (Signed)
[employer] at bedside to serve IVC papers 

## 2015-09-20 NOTE — ED Notes (Signed)
Pt refused vital signs.
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
## 2015-09-20 NOTE — ED Notes (Signed)
Pt is very labile.  He is threatening staff,  Accusing staff of losing his belongings.  He stated that he had glasses and a wallet and a cell phone.  After reviewing belonging sheet with him and showing him that he signed it he became even more angry and cursing staff.  I rechecked belongings and checked in pockets and inside shoes and found none of the items not listed.  Security ,MD, and all staff is aware.  Discharge orders obtained.  Patient refused vitals and said " Just get me out of here"

## 2015-10-19 DIAGNOSIS — B1920 Unspecified viral hepatitis C without hepatic coma: Secondary | ICD-10-CM | POA: Clinically undetermined

## 2015-10-19 DIAGNOSIS — B182 Chronic viral hepatitis C: Secondary | ICD-10-CM
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
## 2015-10-21 DIAGNOSIS — E119 Type 2 diabetes mellitus without complications: Secondary | ICD-10-CM

## 2015-10-21 DIAGNOSIS — B182 Chronic viral hepatitis C: Secondary | ICD-10-CM

## 2015-10-21 DIAGNOSIS — I10 Essential (primary) hypertension: Secondary | ICD-10-CM
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
## 2015-12-18 ENCOUNTER — Emergency Department (HOSPITAL_COMMUNITY): Payer: Self-pay

## 2015-12-18 ENCOUNTER — Encounter (HOSPITAL_COMMUNITY): Payer: Self-pay | Admitting: Emergency Medicine

## 2015-12-18 ENCOUNTER — Emergency Department (HOSPITAL_COMMUNITY)
Admission: EM | Admit: 2015-12-18 | Discharge: 2015-12-18 | Disposition: A | Discharge status: Home / Self Care | Payer: Self-pay | Attending: Emergency Medicine | Admitting: Emergency Medicine

## 2015-12-18 DIAGNOSIS — Z79899 Other long term (current) drug therapy: Secondary | ICD-10-CM | POA: Insufficient documentation

## 2015-12-18 DIAGNOSIS — Z8659 Personal history of other mental and behavioral disorders: Secondary | ICD-10-CM | POA: Insufficient documentation

## 2015-12-18 DIAGNOSIS — Y9389 Activity, other specified: Secondary | ICD-10-CM | POA: Insufficient documentation

## 2015-12-18 DIAGNOSIS — F17200 Nicotine dependence, unspecified, uncomplicated: Secondary | ICD-10-CM | POA: Insufficient documentation

## 2015-12-18 DIAGNOSIS — R52 Pain, unspecified: Secondary | ICD-10-CM

## 2015-12-18 DIAGNOSIS — Y9289 Other specified places as the place of occurrence of the external cause: Secondary | ICD-10-CM | POA: Insufficient documentation

## 2015-12-18 DIAGNOSIS — I10 Essential (primary) hypertension: Secondary | ICD-10-CM | POA: Insufficient documentation

## 2015-12-18 DIAGNOSIS — Y999 Unspecified external cause status: Secondary | ICD-10-CM | POA: Insufficient documentation

## 2015-12-18 DIAGNOSIS — S70311A Abrasion, right thigh, initial encounter: Secondary | ICD-10-CM | POA: Insufficient documentation

## 2015-12-18 DIAGNOSIS — W19XXXA Unspecified fall, initial encounter: Secondary | ICD-10-CM | POA: Insufficient documentation

## 2015-12-18 DIAGNOSIS — S59902A Unspecified injury of left elbow, initial encounter: Secondary | ICD-10-CM | POA: Insufficient documentation

## 2015-12-18 LAB — I-STAT CHEM 8, ED
BUN: 16 mg/dL (ref 6–20)
CALCIUM ION: 1.1 mmol/L — AB (ref 1.12–1.23)
CHLORIDE: 99 mmol/L — AB (ref 101–111)
Creatinine, Ser: 1 mg/dL (ref 0.61–1.24)
Glucose, Bld: 93 mg/dL (ref 65–99)
HCT: 54 % — ABNORMAL HIGH (ref 39.0–52.0)
HEMOGLOBIN: 18.4 g/dL — AB (ref 13.0–17.0)
Potassium: 3.5 mmol/L (ref 3.5–5.1)
SODIUM: 140 mmol/L (ref 135–145)
TCO2: 29 mmol/L (ref 0–100)

## 2015-12-18 MED ORDER — AMLODIPINE BESYLATE 5 MG PO TABS
10.0000 mg | ORAL_TABLET | Freq: Once | ORAL | Status: AC
  Administered 2015-12-18: 10 mg via ORAL
  Filled 2015-12-18: qty 2

## 2015-12-18 MED ORDER — ENALAPRIL MALEATE 20 MG PO TABS: 20.0000 mg | ORAL_TABLET | Freq: Every day | ORAL | Status: DC

## 2015-12-18 MED ORDER — ENALAPRIL MALEATE 20 MG PO TABS
20.0000 mg | ORAL_TABLET | Freq: Once | ORAL | Status: AC
  Administered 2015-12-18: 20 mg via ORAL
  Filled 2015-12-18: qty 1

## 2015-12-18 MED ORDER — HYDROCHLOROTHIAZIDE 25 MG PO TABS
25.0000 mg | ORAL_TABLET | Freq: Once | ORAL | Status: AC
  Administered 2015-12-18: 25 mg via ORAL
  Filled 2015-12-18: qty 1

## 2015-12-18 MED ORDER — HYDROCHLOROTHIAZIDE 25 MG PO TABS: 25.0000 mg | ORAL_TABLET | Freq: Every day | ORAL | Status: DC

## 2015-12-18 MED ORDER — AMLODIPINE BESYLATE 10 MG PO TABS: 10.0000 mg | ORAL_TABLET | Freq: Every day | ORAL | Status: AC

## 2015-12-18 NOTE — ED Notes (Signed)
Pt provided ice pack & ace wrap for R upper leg pain

## 2015-12-18 NOTE — ED Notes (Signed)
Attempted to draw blood x1 

## 2015-12-18 NOTE — ED Notes (Signed)
Unable to obtain blood sample, will have a second RN attempt to draw, Saa with phlebotomy contacted to obtain sample

## 2015-12-18 NOTE — ED Notes (Signed)
Phlebotomy at bedside.

## 2015-12-18 NOTE — Discharge Instructions (Signed)
Hypertension Hypertension, commonly called high blood pressure, is when the force of blood pumping through your arteries is too strong. Your arteries are the blood vessels that carry blood from your heart throughout your body. A blood pressure reading consists of a higher number over a lower number, such as 110/72. The higher number (systolic) is the pressure inside your arteries when your heart pumps. The lower number (diastolic) is the pressure inside your arteries when your heart relaxes. Ideally you want your blood pressure below 120/80. Hypertension forces your heart to work harder to pump blood. Your arteries may become narrow or stiff. Having untreated or uncontrolled hypertension can cause heart attack, stroke, kidney disease, and other problems. RISK FACTORS Some risk factors for high blood pressure are controllable. Others are not.  Risk factors you cannot control include:   Race. You may be at higher risk if you are African American.  Age. Risk increases with age.  Gender. Men are at higher risk than women before age 45 years. After age 65, women are at higher risk than men. Risk factors you can control include:  Not getting enough exercise or physical activity.  Being overweight.  Getting too much fat, sugar, calories, or salt in your diet.  Drinking too much alcohol. SIGNS AND SYMPTOMS Hypertension does not usually cause signs or symptoms. Extremely high blood pressure (hypertensive crisis) may cause headache, anxiety, shortness of breath, and nosebleed. DIAGNOSIS To check if you have hypertension, your health care provider will measure your blood pressure while you are seated, with your arm held at the level of your heart. It should be measured at least twice using the same arm. Certain conditions can cause a difference in blood pressure between your right and left arms. A blood pressure reading that is higher than normal on one occasion does not mean that you need treatment. If  it is not clear whether you have high blood pressure, you may be asked to return on a different day to have your blood pressure checked again. Or, you may be asked to monitor your blood pressure at home for 1 or more weeks. TREATMENT Treating high blood pressure includes making lifestyle changes and possibly taking medicine. Living a healthy lifestyle can help lower high blood pressure. You may need to change some of your habits. Lifestyle changes may include:  Following the DASH diet. This diet is high in fruits, vegetables, and whole grains. It is low in salt, red meat, and added sugars.  Keep your sodium intake below 2,300 mg per day.  Getting at least 30-45 minutes of aerobic exercise at least 4 times per week.  Losing weight if necessary.  Not smoking.  Limiting alcoholic beverages.  Learning ways to reduce stress. Your health care provider may prescribe medicine if lifestyle changes are not enough to get your blood pressure under control, and if one of the following is true:  You are 18-59 years of age and your systolic blood pressure is above 140.  You are 60 years of age or older, and your systolic blood pressure is above 150.  Your diastolic blood pressure is above 90.  You have diabetes, and your systolic blood pressure is over 140 or your diastolic blood pressure is over 90.  You have kidney disease and your blood pressure is above 140/90.  You have heart disease and your blood pressure is above 140/90. Your personal target blood pressure may vary depending on your medical conditions, your age, and other factors. HOME CARE INSTRUCTIONS    Have your blood pressure rechecked as directed by your health care provider.   Take medicines only as directed by your health care provider. Follow the directions carefully. Blood pressure medicines must be taken as prescribed. The medicine does not work as well when you skip doses. Skipping doses also puts you at risk for  problems.  Do not smoke.   Monitor your blood pressure at home as directed by your health care provider. SEEK MEDICAL CARE IF:   You think you are having a reaction to medicines taken.  You have recurrent headaches or feel dizzy.  You have swelling in your ankles.  You have trouble with your vision. SEEK IMMEDIATE MEDICAL CARE IF:  You develop a severe headache or confusion.  You have unusual weakness, numbness, or feel faint.  You have severe chest or abdominal pain.  You vomit repeatedly.  You have trouble breathing. MAKE SURE YOU:   Understand these instructions.  Will watch your condition.  Will get help right away if you are not doing well or get worse.   This information is not intended to replace advice given to you by your health care provider. Make sure you discuss any questions you have with your health care provider.   Document Released: 11/07/2005 Document Revised: 03/24/2015 Document Reviewed: 08/30/2013 Elsevier Interactive Patient Education 2016 [employer]  

## 2015-12-18 NOTE — ED Provider Notes (Signed)
CSN: [id number]     Arrival date & time 12/18/15  0920 History   First MD Initiated Contact with Patient 12/18/15 [phone]     Chief Complaint  Patient presents with  . Hypertension     (Consider location/radiation/quality/duration/timing/severity/associated sxs/prior Treatment) HPI   Frederick [name] w PMH drug abuse, htn, schizophrenia who comes in with complaint of HBP.  He has been off of his blood pressure medicines for the past two weeks.  At 2am this morning her reports that he fell down and injured his right leg and left elbow.  He didn't hit his head, no LOC.  He is having achy pain in his L elbow that is worse with movement, better with rest.  No headache/chest pain/sob/abdominal pain.  Past Medical History  Diagnosis Date  . Drug abuse     heroin  . Hypertension   . Schizophrenia [facility])    Past Surgical History  Procedure Laterality Date  . Nose surgery    . Hand surgery    . Foot surgery     History reviewed. No pertinent family history. Social History  Substance Use Topics  . Smoking status: Current Every Day Smoker -- 0.50 packs/day  . Smokeless tobacco: Never Used  . Alcohol Use: Yes     Comment: last drink 6 months ago    Review of Systems  Constitutional: Negative for fever and chills.  Eyes: Negative for redness.  Respiratory: Negative for cough and shortness of breath.   Cardiovascular: Negative for chest pain.  Gastrointestinal: Negative for nausea, vomiting, abdominal pain and diarrhea.  Genitourinary: Negative for dysuria.  Skin: Negative for rash.  Neurological: Negative for headaches.  All other systems reviewed and are negative.     Allergies  Review of patient's allergies indicates no known allergies.  Home Medications   Prior to Admission medications   Medication Sig Start Date End Date Taking? Authorizing Provider  amLODipine (NORVASC) 10 MG tablet Take 1 tablet (10 mg total) by mouth daily. 12/18/15   Silas Flood, MD  enalapril (VASOTEC) 20 MG  tablet Take 1 tablet (20 mg total) by mouth daily. 12/18/15   Silas Flood, MD  gabapentin (NEURONTIN) 400 MG capsule Take 1,200 mg by mouth 3 (three) times daily.    Historical Provider, MD  hydrochlorothiazide (HYDRODIURIL) 25 MG tablet Take 1 tablet (25 mg total) by mouth daily. 12/18/15   Silas Flood, MD  traMADol (ULTRAM) 50 MG tablet Take 100 mg by mouth 3 (three) times daily.    Historical Provider, MD   BP 156/91 mmHg  Pulse 70  Temp(Src) 97.9 F (36.6 C) (Oral)  Resp 17  SpO2 96% Physical Exam  Constitutional: He is oriented to person, place, and time. No distress.  HENT:  Head: Normocephalic and atraumatic.  Eyes: EOM are normal. Pupils are equal, round, and reactive to light.  Neck: Normal range of motion. Neck supple.  Cardiovascular: Normal rate.   Pulmonary/Chest: Effort normal. No respiratory distress.  Abdominal: Soft. There is no tenderness.  Musculoskeletal: Normal range of motion.  Some small abrasion to the R thigh, no lac.  There is intact motor/sensory function in the R foot.  There is some ttp around the L elbow, no deformity.  The L hand is nvi.   Neurological: He is alert and oriented to person, place, and time.  Skin: No rash noted. He is not diaphoretic.  Psychiatric: He has a normal mood and affect.    ED Course  Procedures (including critical care  time) Labs Review Labs Reviewed  I-STAT CHEM 8, ED - Abnormal; Notable for the following:    Chloride 99 (*)    Calcium, Ion 1.10 (*)    Hemoglobin 18.4 (*)    HCT 54.0 (*)    All other components within normal limits    Imaging Review Dg Elbow Complete Left  12/18/2015  CLINICAL DATA:  Pain following fall EXAM: LEFT ELBOW - COMPLETE 3+ VIEW COMPARISON:  None. FINDINGS: Frontal, lateral, and bilateral oblique views were obtained. There is soft tissue swelling. No demonstrable fracture or dislocation. No joint effusion. There is no appreciable joint space narrowing. There is a small spur arising from the  olecranon process of the proximal ulna. IMPRESSION: Soft tissue swelling. No fracture or dislocation. Small olecranon spur. No erosive change. Electronically Signed   By: Bretta Bang III [name].D.   On: 12/18/2015 10:39   Dg Femur, Min 2 Views Right  12/18/2015  CLINICAL DATA:  Status post fall. EXAM: RIGHT FEMUR 2 VIEWS COMPARISON:  None. FINDINGS: There is no evidence of fracture or other focal bone lesions. Soft tissues are unremarkable. IMPRESSION: Negative. Electronically Signed   By: Elige Ko   On: 12/18/2015 10:38   I have personally reviewed and evaluated these images and lab results as part of my medical decision-making.   EKG Interpretation None      MDM   Final diagnoses:  Essential hypertension    38 y [name] w PMH drug abuse, htn, schizophrenia who comes in with complaint of HBP and a mechanical fall.  Exam as above.  Will obtain plain films of the R thigh and L elbow.  Patient w/o sx of end organ damage, will obtain chem 8 to eval creatinine.  Patient has been off of his bp meds x2 weeks which is likely the etiology of his htn.  Will give home meds.  Plain films of L elbow and R femur neg.  Creatinine wnl.  Will d/c with home bp meds and pcp f/u  I have discussed the results, Dx and Tx plan with the pt. They expressed understanding and agree with the plan and were told to return to ED with any worsening of condition or concern.    Disposition: Discharge  Condition: Good  New Prescriptions   No medications on file    Follow Up: Lavinia Sharps, NP [address] [place] [address] [phone]  In 3 days    Pt seen in conjunction with Dr. Benjie Karvonen, MD 12/18/15 1257  Marily Memos, MD 12/18/15 [phone]
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
## 2016-02-22 ENCOUNTER — Ambulatory Visit: Payer: Self-pay

## 2016-03-01 ENCOUNTER — Emergency Department (HOSPITAL_COMMUNITY): Payer: Self-pay

## 2016-03-01 ENCOUNTER — Encounter (HOSPITAL_COMMUNITY): Payer: Self-pay | Admitting: Emergency Medicine

## 2016-03-01 ENCOUNTER — Inpatient Hospital Stay (HOSPITAL_COMMUNITY)
Admission: EM | Admit: 2016-03-01 | Discharge: 2016-03-06 | DRG: 854 | Disposition: A | Discharge status: Home / Self Care | Payer: Self-pay | Attending: Internal Medicine | Admitting: Internal Medicine

## 2016-03-01 DIAGNOSIS — L03115 Cellulitis of right lower limb: Secondary | ICD-10-CM | POA: Diagnosis present

## 2016-03-01 DIAGNOSIS — F209 Schizophrenia, unspecified: Secondary | ICD-10-CM | POA: Diagnosis present

## 2016-03-01 DIAGNOSIS — F1910 Other psychoactive substance abuse, uncomplicated: Secondary | ICD-10-CM

## 2016-03-01 DIAGNOSIS — M7060 Trochanteric bursitis, unspecified hip: Secondary | ICD-10-CM | POA: Diagnosis present

## 2016-03-01 DIAGNOSIS — I10 Essential (primary) hypertension: Secondary | ICD-10-CM

## 2016-03-01 DIAGNOSIS — F1120 Opioid dependence, uncomplicated: Secondary | ICD-10-CM | POA: Diagnosis present

## 2016-03-01 DIAGNOSIS — A4102 Sepsis due to Methicillin resistant Staphylococcus aureus: Principal | ICD-10-CM | POA: Diagnosis present

## 2016-03-01 DIAGNOSIS — F1410 Cocaine abuse, uncomplicated: Secondary | ICD-10-CM | POA: Diagnosis present

## 2016-03-01 DIAGNOSIS — M25559 Pain in unspecified hip: Secondary | ICD-10-CM

## 2016-03-01 DIAGNOSIS — G629 Polyneuropathy, unspecified: Secondary | ICD-10-CM | POA: Diagnosis present

## 2016-03-01 DIAGNOSIS — L0390 Cellulitis, unspecified: Secondary | ICD-10-CM | POA: Diagnosis present

## 2016-03-01 DIAGNOSIS — Z720 Tobacco use: Secondary | ICD-10-CM

## 2016-03-01 DIAGNOSIS — A419 Sepsis, unspecified organism: Secondary | ICD-10-CM | POA: Diagnosis present

## 2016-03-01 DIAGNOSIS — F17290 Nicotine dependence, other tobacco product, uncomplicated: Secondary | ICD-10-CM | POA: Diagnosis present

## 2016-03-01 DIAGNOSIS — Z452 Encounter for adjustment and management of vascular access device: Secondary | ICD-10-CM

## 2016-03-01 HISTORY — DX: Tobacco use: Z72.0

## 2016-03-01 HISTORY — DX: Unspecified viral hepatitis C without hepatic coma: B19.20

## 2016-03-01 LAB — CBC WITH DIFFERENTIAL/PLATELET
BASOS ABS: 0 10*3/uL (ref 0.0–0.1)
Basophils Relative: 0 %
EOS ABS: 0 10*3/uL (ref 0.0–0.7)
EOS PCT: 0 %
HCT: 41.8 % (ref 39.0–52.0)
HEMOGLOBIN: 13.7 g/dL (ref 13.0–17.0)
Lymphocytes Relative: 12 %
Lymphs Abs: 1.7 10*3/uL (ref 0.7–4.0)
MCH: 29.5 pg (ref 26.0–34.0)
MCHC: 32.8 g/dL (ref 30.0–36.0)
MCV: 89.9 fL (ref 78.0–100.0)
Monocytes Absolute: 1.1 10*3/uL — ABNORMAL HIGH (ref 0.1–1.0)
Monocytes Relative: 8 %
NEUTROS PCT: 80 %
Neutro Abs: 11.2 10*3/uL — ABNORMAL HIGH (ref 1.7–7.7)
PLATELETS: 188 10*3/uL (ref 150–400)
RBC: 4.65 MIL/uL (ref 4.22–5.81)
RDW: 13.9 % (ref 11.5–15.5)
WBC: 14 10*3/uL — AB (ref 4.0–10.5)

## 2016-03-01 LAB — BASIC METABOLIC PANEL
ANION GAP: 7 (ref 5–15)
BUN: 16 mg/dL (ref 6–20)
CHLORIDE: 107 mmol/L (ref 101–111)
CO2: 28 mmol/L (ref 22–32)
Calcium: 8.7 mg/dL — ABNORMAL LOW (ref 8.9–10.3)
Creatinine, Ser: 1.21 mg/dL (ref 0.61–1.24)
Glucose, Bld: 108 mg/dL — ABNORMAL HIGH (ref 65–99)
POTASSIUM: 3.5 mmol/L (ref 3.5–5.1)
SODIUM: 142 mmol/L (ref 135–145)

## 2016-03-01 MED ORDER — PIPERACILLIN-TAZOBACTAM 3.375 G IVPB 30 MIN
3.3750 g | INTRAVENOUS | Status: AC
  Administered 2016-03-02: 3.375 g via INTRAVENOUS
  Filled 2016-03-01: qty 50

## 2016-03-01 MED ORDER — ONDANSETRON HCL 4 MG PO TABS: 4.0000 mg | ORAL_TABLET | Freq: Four times a day (QID) | ORAL | Status: DC | PRN

## 2016-03-01 MED ORDER — ACETAMINOPHEN 325 MG PO TABS
650.0000 mg | ORAL_TABLET | Freq: Four times a day (QID) | ORAL | Status: DC | PRN
  Administered 2016-03-02 – 2016-03-05 (×3): 650 mg via ORAL
  Filled 2016-03-01 (×4): qty 2

## 2016-03-01 MED ORDER — LORAZEPAM 2 MG/ML IJ SOLN
0.5000 mg | Freq: Four times a day (QID) | INTRAMUSCULAR | Status: DC | PRN
  Administered 2016-03-02 – 2016-03-03 (×2): 0.5 mg via INTRAVENOUS
  Filled 2016-03-01 (×2): qty 1

## 2016-03-01 MED ORDER — GABAPENTIN 400 MG PO CAPS
1200.0000 mg | ORAL_CAPSULE | Freq: Three times a day (TID) | ORAL | Status: DC
  Administered 2016-03-02 – 2016-03-06 (×13): 1200 mg via ORAL
  Filled 2016-03-01 (×15): qty 3

## 2016-03-01 MED ORDER — SODIUM CHLORIDE 0.9% FLUSH
3.0000 mL | Freq: Two times a day (BID) | INTRAVENOUS | Status: DC
  Administered 2016-03-02 – 2016-03-05 (×4): 3 mL via INTRAVENOUS

## 2016-03-01 MED ORDER — ENOXAPARIN SODIUM 40 MG/0.4ML ~~LOC~~ SOLN
40.0000 mg | Freq: Every day | SUBCUTANEOUS | Status: DC
  Administered 2016-03-02 – 2016-03-05 (×5): 40 mg via SUBCUTANEOUS
  Filled 2016-03-01 (×6): qty 0.4

## 2016-03-01 MED ORDER — SODIUM CHLORIDE 0.9 % IV SOLN
INTRAVENOUS | Status: DC
  Administered 2016-03-01 – 2016-03-03 (×5): via INTRAVENOUS

## 2016-03-01 MED ORDER — HYDROMORPHONE HCL 1 MG/ML IJ SOLN
1.0000 mg | Freq: Once | INTRAMUSCULAR | Status: AC
Start: 2016-03-01 — End: 2016-03-01
  Administered 2016-03-01: 1 mg via INTRAVENOUS
  Filled 2016-03-01: qty 1

## 2016-03-01 MED ORDER — AMLODIPINE BESYLATE 10 MG PO TABS
10.0000 mg | ORAL_TABLET | Freq: Every day | ORAL | Status: DC
  Administered 2016-03-02 – 2016-03-06 (×6): 10 mg via ORAL
  Filled 2016-03-01 (×7): qty 1

## 2016-03-01 MED ORDER — FENTANYL CITRATE (PF) 100 MCG/2ML IJ SOLN
50.0000 ug | Freq: Once | INTRAMUSCULAR | Status: AC
  Administered 2016-03-01: 50 ug via INTRAVENOUS
  Filled 2016-03-01: qty 2

## 2016-03-01 MED ORDER — ENALAPRIL MALEATE 20 MG PO TABS
20.0000 mg | ORAL_TABLET | Freq: Every day | ORAL | Status: DC
  Administered 2016-03-02: 20 mg via ORAL
  Filled 2016-03-01 (×3): qty 1

## 2016-03-01 MED ORDER — ACETAMINOPHEN 650 MG RE SUPP: 650.0000 mg | Freq: Four times a day (QID) | RECTAL | Status: DC | PRN

## 2016-03-01 MED ORDER — PIPERACILLIN-TAZOBACTAM 3.375 G IVPB
3.3750 g | Freq: Three times a day (TID) | INTRAVENOUS | Status: DC
  Administered 2016-03-02 – 2016-03-05 (×10): 3.375 g via INTRAVENOUS
  Filled 2016-03-01 (×13): qty 50

## 2016-03-01 MED ORDER — TRAMADOL HCL 50 MG PO TABS
50.0000 mg | ORAL_TABLET | Freq: Four times a day (QID) | ORAL | Status: DC | PRN
  Administered 2016-03-02 – 2016-03-04 (×3): 50 mg via ORAL
  Filled 2016-03-01 (×4): qty 1

## 2016-03-01 MED ORDER — HYDRALAZINE HCL 20 MG/ML IJ SOLN
5.0000 mg | INTRAMUSCULAR | Status: DC | PRN
  Administered 2016-03-02: 5 mg via INTRAVENOUS
  Filled 2016-03-01: qty 1

## 2016-03-01 MED ORDER — NICOTINE 21 MG/24HR TD PT24: 21.0000 mg | MEDICATED_PATCH | Freq: Every day | TRANSDERMAL | Status: DC

## 2016-03-01 MED ORDER — VANCOMYCIN HCL 10 G IV SOLR
1250.0000 mg | INTRAVENOUS | Status: AC
  Administered 2016-03-01: 1250 mg via INTRAVENOUS
  Filled 2016-03-01: qty 1250

## 2016-03-01 MED ORDER — VANCOMYCIN HCL 10 G IV SOLR
1250.0000 mg | Freq: Two times a day (BID) | INTRAVENOUS | Status: DC
  Administered 2016-03-02 – 2016-03-06 (×8): 1250 mg via INTRAVENOUS
  Filled 2016-03-01 (×11): qty 1250

## 2016-03-01 MED ORDER — NICOTINE 21 MG/24HR TD PT24
21.0000 mg | MEDICATED_PATCH | Freq: Every day | TRANSDERMAL | Status: DC
  Administered 2016-03-02 – 2016-03-06 (×5): 21 mg via TRANSDERMAL
  Filled 2016-03-01 (×5): qty 1

## 2016-03-01 MED ORDER — ONDANSETRON HCL 4 MG/2ML IJ SOLN
4.0000 mg | Freq: Four times a day (QID) | INTRAMUSCULAR | Status: DC | PRN
  Administered 2016-03-03 – 2016-03-06 (×3): 4 mg via INTRAVENOUS
  Filled 2016-03-01 (×2): qty 2

## 2016-03-01 MED ORDER — SODIUM CHLORIDE 0.9 % IV BOLUS (SEPSIS): 3000.0000 mL | Freq: Once | INTRAVENOUS | Status: DC

## 2016-03-01 NOTE — ED Notes (Signed)
Pt's contact(sister) [phone].

## 2016-03-01 NOTE — ED Notes (Signed)
Patient is hard to arouse but able to answer commands briefly. Patient states that he is in severe pain but is sleeping peacefully in the hallway.

## 2016-03-01 NOTE — Progress Notes (Addendum)
Pharmacy Antibiotic Note  Frederick [name] is a 55 y.o. male admitted on 03/01/2016 with cellulitis/abscess of right hip.  Noted pt injected heroin into this area 6 days ago.  Pharmacy has been consulted for Vancomycin & Zosyn dosing.  Plan: Vancomycin 1250 mg IV every 12 hours.  Goal trough 10-15 mcg/mL. Zosyn 3.375gm IV q8h (each dose infused over 4 hrs)  Height: 6\' 2"  (188 cm) Weight: 210 lb (95.255 kg) IBW/kg (Calculated) : 82.2  Temp (24hrs), Avg:98.5 F (36.9 C), Min:98.5 F (36.9 C), Max:98.5 F (36.9 C)   Recent Labs Lab 03/01/16 2017  WBC 14.0*  CREATININE 1.21    Estimated Creatinine Clearance: 80.2 mL/min (by C-G formula based on Cr of 1.21).    No Known Allergies  Antimicrobials this admission: 4/11 Vanc >>   4/11 Zosyn >>  Dose adjustments this admission:    Microbiology results: 4/11 BCx:  Thank you for allowing pharmacy to be a part of this patient's care.  Frederick [name], Frederick [name], PharmD 03/01/2016 9:56 PM

## 2016-03-01 NOTE — ED Notes (Signed)
Per EMS, patient complaining of severe right leg/hip pain. Patient has hx of back/disc issues. Hx of substance abuse. Denies substance use today. EMS found syringes at bedside. Patient reports not taking his scheduled prescription medications.

## 2016-03-01 NOTE — ED Notes (Signed)
MD at bedside. 

## 2016-03-01 NOTE — ED Provider Notes (Addendum)
CSN: [id number]     Arrival date & time 03/01/16  1633 History   First MD Initiated Contact with Patient 03/01/16 1651     Chief Complaint  Patient presents with  . Leg Pain     (Consider location/radiation/quality/duration/timing/severity/associated sxs/prior Treatment) HPI....Marland Kitchen.Marland Kitchen.Pain in right lateral hip for several days, getting worse. No fever sweats or chills. Patient injected heroin into the soft tissue of this area approximate 6 days ago. No trauma. Severity of pain is moderate. Nothing makes symptoms better or worse.  Past Medical History  Diagnosis Date  . Drug abuse     heroin  . Hypertension   . Schizophrenia [facility](HCC)    Past Surgical History  Procedure Laterality Date  . Nose surgery    . Hand surgery    . Foot surgery     No family history on file. Social History  Substance Use Topics  . Smoking status: Current Every Day Smoker -- 0.50 packs/day  . Smokeless tobacco: Never Used  . Alcohol Use: Yes     Comment: last drink 6 months ago    Review of Systems  All other systems reviewed and are negative.     Allergies  Review of patient's allergies indicates no known allergies.  Home Medications   Prior to Admission medications   Medication Sig Start Date End Date Taking? Authorizing Provider  amLODipine (NORVASC) 10 MG tablet Take 1 tablet (10 mg total) by mouth daily. 12/18/15  Yes Silas FloodErik Proulx, MD  enalapril (VASOTEC) 20 MG tablet Take 1 tablet (20 mg total) by mouth daily. 12/18/15  Yes Silas FloodErik Proulx, MD  gabapentin (NEURONTIN) 300 MG capsule Take 1,200 mg by mouth 3 (three) times daily.   Yes Historical Provider, MD  hydrochlorothiazide (HYDRODIURIL) 25 MG tablet Take 1 tablet (25 mg total) by mouth daily. 12/18/15  Yes Silas FloodErik Proulx, MD   BP 154/82 mmHg  Pulse 92  Temp(Src) 98.5 F (36.9 C) (Oral)  Resp 18  Ht 6\' 2"  (1.88 m)  Wt 210 lb (95.255 kg)  BMI 26.95 kg/m2  SpO2 98% Physical Exam  Constitutional: He is oriented to person, place, and time. He  appears well-developed and well-nourished.  HENT:  Head: Normocephalic and atraumatic.  Eyes: Conjunctivae and EOM are normal. Pupils are equal, round, and reactive to light.  Neck: Normal range of motion. Neck supple.  Cardiovascular: Normal rate and regular rhythm.   Pulmonary/Chest: Effort normal and breath sounds normal.  Abdominal: Soft. Bowel sounds are normal.  Musculoskeletal: Normal range of motion.  Neurological: He is alert and oriented to person, place, and time.  Skin:  Right lateral hip: Area of erythema induration approximately 15 cm2  Psychiatric: He has a normal mood and affect. His behavior is normal.  Nursing note and vitals reviewed.   ED Course  Procedures (including critical care time) Labs Review Labs Reviewed  CBC WITH DIFFERENTIAL/PLATELET - Abnormal; Notable for the following:    WBC 14.0 (*)    Neutro Abs 11.2 (*)    Monocytes Absolute 1.1 (*)    All other components within normal limits  BASIC METABOLIC PANEL - Abnormal; Notable for the following:    Glucose, Bld 108 (*)    Calcium 8.7 (*)    All other components within normal limits    Imaging Review Dg Hip Unilat With Pelvis 2-3 Views Right  03/01/2016  CLINICAL DATA:  Right hip pain for 2 days, no known injury, initial encounter EXAM: DG HIP (WITH OR WITHOUT PELVIS) 2-3V RIGHT COMPARISON:  None. FINDINGS: Pelvic ring is intact. No acute fracture or dislocation is noted. No gross soft tissue abnormality is seen. IMPRESSION: No acute abnormality noted. Electronically Signed   By: Alcide Clever M.D.   On: 03/01/2016 20:19   I have personally reviewed and evaluated these images and lab results as part of my medical decision-making.   EKG Interpretation None      MDM   Final diagnoses:  Cellulitis of right hip    Patient has cellulitis of the right lateral hip secondary to heroin injection. IV vancomycin. Admit to general medicine.    Donnetta Hutching, MD 03/01/16 1610  Donnetta Hutching,  MD 03/01/16 2206

## 2016-03-01 NOTE — H&P (Addendum)
[facility] History and Physical  CHARLIS HARNER EYC:[id number] DOB: [birth year] DOA: 03/01/2016  Referring physician: ED physician PCP: Carmie Kanner, NP  Specialists:   Chief Complaint: lateral hip pain  HPI: Frederick [name] is a 55 y.o. male with PMH of polysubstance abuse, including tobacco abuse, hearing abuse, cocaine abuse, schizophrenia, HCV, hypertension, who presents with lateral hip pain.  Pt reports that he injected heroin into the soft tissue of his right lateral hip area approximate 6 days ago. He developedsevere pain and erythema over this area. He does not have fever or chills. He has nausea, no vomiting or diarrhea. Patient denies chest pain, shortness of breath, cough, symptoms of UTI or unilateral weakness.  In ED, patient was found to have WBC 14.0, temperature normal, heart rate 92, electrolytes and renal function okay. X-ray of her right hip is negative for acute abnormalities. Patient is admitted to inpatient for further eval and treatment.  EKG: Not done in ED, will get one.   Where does patient live?   At home   SNF    Assistant living facility   [facility]irement center Can patient participate in ADLs?  Yes     Barely     None  Little     Some   Review of Systems:   General: no fevers, chills, no changes in body weight, has poor appetite, has fatigue HEENT: no blurry vision, hearing changes or sore throat Pulm: no dyspnea, coughing, wheezing CV: no chest pain, no palpitations Abd: has nausea, no vomiting, abdominal pain, diarrhea, constipation GU: no dysuria, burning on urination, increased urinary frequency, hematuria  Ext: no leg edema Neuro: no unilateral weakness, numbness, or tingling, no vision change or hearing loss Skin: has erythema over lateral hip area MSK: No muscle spasm, no deformity, no limitation of range of movement in spin Heme: No easy bruising.  Travel history: No recent long distant travel.  Allergy: No Known Allergies  Past  Medical History  Diagnosis Date  . Drug abuse     heroin  . Hypertension   . Schizophrenia ([place])   . Tobacco abuse   . HCV (hepatitis C virus)     Past Surgical History  Procedure Laterality Date  . Nose surgery    . Hand surgery    . Foot surgery      Social History:  reports that he has been smoking.  He has never used smokeless tobacco. He reports that he drinks alcohol. He reports that he uses illicit drugs.  Family History:  Family History  Problem Relation Age of Onset  . Diabetes Mother   . Hypertension Mother   . Diabetes Father      Prior to Admission medications   Medication Sig Start Date End Date Taking? Authorizing Provider  amLODipine (NORVASC) 10 MG tablet Take 1 tablet (10 mg total) by mouth daily. 12/18/15  Yes Jarome Matin, MD  enalapril (VASOTEC) 20 MG tablet Take 1 tablet (20 mg total) by mouth daily. 12/18/15  Yes Jarome Matin, MD  gabapentin (NEURONTIN) 300 MG capsule Take 1,200 mg by mouth 3 (three) times daily.   Yes Historical Provider, MD  hydrochlorothiazide (HYDRODIURIL) 25 MG tablet Take 1 tablet (25 mg total) by mouth daily. 12/18/15  Yes Jarome Matin, MD    Physical Exam: Filed Vitals:   03/01/16 1644 03/01/16 1652 03/01/16 1846 03/01/16 2200  BP:  159/84 154/82 166/92  Pulse:  94 92 91  Temp:  98.5 F (36.9 C)    TempSrc:  Oral    Resp:  _0 Height: _1  (1.88 m)     Weight: 95.255 kg (210 lb)     SpO2:  96% 98% 95%   General: Not in acute distress. Dry mucus and membrane HEENT:       Eyes: PERRL, EOMI, no scleral icterus.       ENT: No discharge from the ears and nose, no pharynx injection, no tonsillar enlargement.        Neck: No JVD, no bruit, no mass felt. Heme: No neck lymph node enlargement. Cardiac: S1/S2, RRR, No murmurs, No gallops or rubs. Pulm: No rales, wheezing, rhonchi or rubs. Abd: Soft, nondistended, nontender, no rebound pain, no organomegaly, BS present. Ext: No pitting leg edema bilaterally. 2+DP/PT pulse  bilaterally. Musculoskeletal: No joint deformities, No joint redness or warmth, no limitation of ROM in spin. Skin: has area of erythema, induration over right lateral hip area,  approximately 15 cm in diameter, with warmth and tenderness. Neuro: Alert, oriented X3, cranial nerves II-XII grossly intact, moves all extremities normally.  Psych: Patient is not psychotic, no suicidal or hemocidal ideation.  Labs on Admission:  Basic Metabolic Panel:  Recent Labs Lab 03/01/16 2017  NA 142  K 3.5  CL 107  CO2 28  GLUCOSE 108*  BUN 16  CREATININE 1.21  CALCIUM 8.7*   Liver Function Tests: No results for input(s): AST, ALT, ALKPHOS, BILITOT, PROT, ALBUMIN in the last 168 hours. No results for input(s): LIPASE, AMYLASE in the last 168 hours. No results for input(s): AMMONIA in the last 168 hours. CBC:  Recent Labs Lab 03/01/16 2017  WBC 14.0*  NEUTROABS 11.2*  HGB 13.7  HCT 41.8  MCV 89.9  PLT 188   Cardiac Enzymes: No results for input(s): CKTOTAL, CKMB, CKMBINDEX, TROPONINI in the last 168 hours.  BNP (last 3 results) No results for input(s): BNP in the last 8760 hours.  ProBNP (last 3 results) No results for input(s): PROBNP in the last 8760 hours.  CBG: No results for input(s): GLUCAP in the last 168 hours.  Radiological Exams on Admission: Dg Hip Unilat With Pelvis 2-3 Views Right  03/01/2016  CLINICAL DATA:  Right hip pain for 2 days, no known injury, initial encounter EXAM: DG HIP (WITH OR WITHOUT PELVIS) 2-3V RIGHT COMPARISON:  None. FINDINGS: Pelvic ring is intact. No acute fracture or dislocation is noted. No gross soft tissue abnormality is seen. IMPRESSION: No acute abnormality noted. Electronically Signed   By: Inez Catalina M.D.   On: 03/01/2016 20:19    Assessment/Plan Principal Problem:   Cellulitis of right hip Active Problems:   Drug abuse   Hypertension   Tobacco abuse   Sepsis ([place])   Cellulitis of right hip and sepsis:  Pt has tenderness,  warmth, induration and erythema over right lateral hip area, consistent with cellulitis. Patient meets criteria for sepsis with heart rate>90 and leukocytosis. Lactate is pending. Currently hemodynamically stable.  - will admit to tele bed - Empiric antimicrobial treatment with vancomycin and Zosyn per pharmacy - PRN Zofran for nausea, tramadol prn for pain.  - Avoid IV nacrotics - Blood cultures x 2  - ESR and CRP - will get Procalcitonin and trend lactic acid levels per sepsis protocol. - IVF: 2.0L of NS bolus in ED, followed by 100 cc/h  Polysubstance abuse: Including cocaine, tobacco and hearing -Did counseling about importance of quitting substance use -Nicotine patch -Check UDS and HIV antibody  HTN: bp=154/82 -Gold  HCTZ due to sepsis -continue amlodipine and enalapril -IV hydralazine when necessary   DVT ppx:  SQ Lovenox  Code Status: Full code Family Communication: None at bed side. Disposition Plan: Admit to inpatient   Date of Service 03/01/2016    Ivor Costa [facility] Pager [phone]  If 7PM-7AM, please contact night-coverage [email or web address] Password [facility] 03/01/2016, 11:27 PM

## 2016-03-02 ENCOUNTER — Observation Stay (HOSPITAL_COMMUNITY): Payer: Self-pay

## 2016-03-02 DIAGNOSIS — L0390 Cellulitis, unspecified: Secondary | ICD-10-CM | POA: Diagnosis present

## 2016-03-02 LAB — COMPREHENSIVE METABOLIC PANEL
ALBUMIN: 3.1 g/dL — AB (ref 3.5–5.0)
ALK PHOS: 79 U/L (ref 38–126)
ALT: 43 U/L (ref 17–63)
ANION GAP: 9 (ref 5–15)
AST: 44 U/L — AB (ref 15–41)
BUN: 17 mg/dL (ref 6–20)
CALCIUM: 8.5 mg/dL — AB (ref 8.9–10.3)
CO2: 26 mmol/L (ref 22–32)
Chloride: 105 mmol/L (ref 101–111)
Creatinine, Ser: 1.19 mg/dL (ref 0.61–1.24)
GFR calc Af Amer: >60 mL/min (ref >60)
GFR calc non Af Amer: >60 mL/min (ref >60)
GLUCOSE: 150 mg/dL — AB (ref 65–99)
POTASSIUM: 3.5 mmol/L (ref 3.5–5.1)
SODIUM: 140 mmol/L (ref 135–145)
Total Bilirubin: 2.1 mg/dL — ABNORMAL HIGH (ref 0.3–1.2)
Total Protein: 6.4 g/dL — ABNORMAL LOW (ref 6.5–8.1)

## 2016-03-02 LAB — APTT: aPTT: 43 seconds — ABNORMAL HIGH (ref 24–37)

## 2016-03-02 LAB — CBC
HEMATOCRIT: 42.8 % (ref 39.0–52.0)
HEMOGLOBIN: 14.2 g/dL (ref 13.0–17.0)
MCH: 29.3 pg (ref 26.0–34.0)
MCHC: 33.2 g/dL (ref 30.0–36.0)
MCV: 88.2 fL (ref 78.0–100.0)
Platelets: 191 10*3/uL (ref 150–400)
RBC: 4.85 MIL/uL (ref 4.22–5.81)
RDW: 13.5 % (ref 11.5–15.5)
WBC: 14.1 10*3/uL — ABNORMAL HIGH (ref 4.0–10.5)

## 2016-03-02 LAB — PROTIME-INR
INR: 1.16 (ref 0.00–1.49)
Prothrombin Time: 15 seconds (ref 11.6–15.2)

## 2016-03-02 LAB — SEDIMENTATION RATE: SED RATE: 23 mm/h — AB (ref 0–16)

## 2016-03-02 LAB — HIV ANTIBODY (ROUTINE TESTING W REFLEX): HIV SCREEN 4TH GENERATION: NONREACTIVE

## 2016-03-02 LAB — RAPID URINE DRUG SCREEN, HOSP PERFORMED
Amphetamines: NOT DETECTED
BARBITURATES: NOT DETECTED
Benzodiazepines: NOT DETECTED
Cocaine: POSITIVE — AB
Opiates: POSITIVE — AB
TETRAHYDROCANNABINOL: NOT DETECTED

## 2016-03-02 LAB — C-REACTIVE PROTEIN: CRP: 13.2 mg/dL — AB (ref <1.0)

## 2016-03-02 LAB — PROCALCITONIN: PROCALCITONIN: 0.38 ng/mL

## 2016-03-02 LAB — LACTIC ACID, PLASMA: LACTIC ACID, VENOUS: 1.3 mmol/L (ref 0.5–2.0)

## 2016-03-02 MED ORDER — LIP MEDEX EX OINT
TOPICAL_OINTMENT | CUTANEOUS | Status: AC
Start: 2016-03-02 — End: 2016-03-02
  Filled 2016-03-02: qty 7

## 2016-03-02 MED ORDER — SODIUM CHLORIDE 0.9 % IV BOLUS (SEPSIS)
2000.0000 mL | Freq: Once | INTRAVENOUS | Status: AC
  Administered 2016-03-02: 2000 mL via INTRAVENOUS

## 2016-03-02 MED ORDER — MORPHINE SULFATE (PF) 2 MG/ML IV SOLN
1.0000 mg | INTRAVENOUS | Status: DC | PRN
  Administered 2016-03-02 (×2): 2 mg via INTRAVENOUS
  Administered 2016-03-02: 1 mg via INTRAVENOUS
  Administered 2016-03-03 – 2016-03-04 (×6): 2 mg via INTRAVENOUS
  Filled 2016-03-02 (×9): qty 1

## 2016-03-02 MED ORDER — HYDRALAZINE HCL 20 MG/ML IJ SOLN
5.0000 mg | Freq: Four times a day (QID) | INTRAMUSCULAR | Status: DC | PRN
  Administered 2016-03-03: 5 mg via INTRAVENOUS

## 2016-03-02 MED ORDER — IOPAMIDOL (ISOVUE-300) INJECTION 61%
100.0000 mL | Freq: Once | INTRAVENOUS | Status: AC | PRN
  Administered 2016-03-02: 100 mL via INTRAVENOUS

## 2016-03-02 NOTE — Consult Note (Signed)
ORTHOPAEDIC CONSULTATION  REQUESTING PHYSICIAN: Alba Cory, MD  Chief Complaint: right thigh pain  HPI: Frederick [name] is a 55 y.o. male who complains of lateral right thigh pain. This started roughly 1 week ago. He has a history of polysubstance abuse and was injected heroin into the soft tissue of his right thigh. He had a CT which demonstrated fluid collection near his greater troch bursa. He's had erythema and symptoms of bacteremia as well.  Past Medical History  Diagnosis Date  . Drug abuse     heroin  . Hypertension   . Schizophrenia (HCC)   . Tobacco abuse   . HCV (hepatitis C virus)    Past Surgical History  Procedure Laterality Date  . Nose surgery    . Hand surgery    . Foot surgery     Social History   Social History  . Marital Status: Single    Spouse Name: N/A  . Number of Children: N/A  . Years of Education: N/A   Social History Main Topics  . Smoking status: Current Every Day Smoker -- 0.50 packs/day  . Smokeless tobacco: Never Used  . Alcohol Use: Yes     Comment: last drink 6 months ago  . Drug Use: Yes     Comment: heroin  . Sexual Activity: Not Asked   Other Topics Concern  . None   Social History Narrative   Family History  Problem Relation Age of Onset  . Diabetes Mother   . Hypertension Mother   . Diabetes Father    No Known Allergies Prior to Admission medications   Medication Sig Start Date End Date Taking? Authorizing Provider  amLODipine (NORVASC) 10 MG tablet Take 1 tablet (10 mg total) by mouth daily. 12/18/15  Yes Silas Flood, MD  enalapril (VASOTEC) 20 MG tablet Take 1 tablet (20 mg total) by mouth daily. 12/18/15  Yes Silas Flood, MD  gabapentin (NEURONTIN) 300 MG capsule Take 1,200 mg by mouth 3 (three) times daily.   Yes Historical Provider, MD  hydrochlorothiazide (HYDRODIURIL) 25 MG tablet Take 1 tablet (25 mg total) by mouth daily. 12/18/15  Yes Silas Flood, MD   Ct Hip Right W Contrast  03/02/2016   CLINICAL DATA:  Right lateral hip pain after recent drug injection approximately 6 days ago. Leukocytosis. EXAM: CT OF THE RIGHT HIP WITH CONTRAST TECHNIQUE: Multidetector CT imaging was performed following the standard protocol during bolus administration of intravenous contrast. CONTRAST:  ISOVUE-300 IOPAMIDOL (ISOVUE-300) INJECTION 61% COMPARISON:  Radiographs 03/01/2016. FINDINGS: No evidence of acute fracture or dislocation. There is no evidence of femoral head avascular necrosis. The right hip joint space is maintained. The visualized right sacroiliac joint demonstrates no acute findings, although may be partially ankylosed. Motion artifact accounts for irregularity of the mid sacrum on the reformatted views. Lateral to the right greater trochanter, there is an ill-defined fluid collection which demonstrates low level peripheral enhancement. This measures approximately 13.5 x 2.6 x 4.4 cm. No foreign body or soft tissue emphysema is identified in this area. The overlying subcutaneous fat demonstrates soft tissue stranding without other focal fluid collection or soft tissue emphysema. No intramuscular fluid collections are seen. There is no significant right hip joint effusion. No intrapelvic inflammatory changes or fluid collections are seen. Bladder wall thickening is probably related to limited bladder distention. IMPRESSION: 1. Peritrochanteric fluid collection on the right consistent with trochanteric bursitis. Given the patient's history, this is potentially infected. Fluid sampling/drainage should  be considered. 2. Overlying soft tissue stranding in the subcutaneous fat lateral to the right hip consistent with cellulitis. 3. No evidence of deep abscess, septic joint or osteomyelitis. Electronically Signed   By: Carey BullocksWilliam  Veazey M.D.   On: 03/02/2016 18:48   Dg Hip Unilat With Pelvis 2-3 Views Right  03/01/2016  CLINICAL DATA:  Right hip pain for 2 days, no known injury, initial encounter EXAM: DG  HIP (WITH OR WITHOUT PELVIS) 2-3V RIGHT COMPARISON:  None. FINDINGS: Pelvic ring is intact. No acute fracture or dislocation is noted. No gross soft tissue abnormality is seen. IMPRESSION: No acute abnormality noted. Electronically Signed   By: Alcide CleverMark  Lukens M.D.   On: 03/01/2016 20:19    Positive ROS: All other systems have been reviewed and were otherwise negative with the exception of those mentioned in the HPI and as above.  Labs cbc  Recent Labs  03/01/16 2017 03/02/16 0003  WBC 14.0* 14.1*  HGB 13.7 14.2  HCT 41.8 42.8  PLT 188 191    Labs inflam  Recent Labs  03/01/16 2346  CRP 13.2*    Labs coag  Recent Labs  03/01/16 2247  INR 1.16     Recent Labs  03/01/16 2017 03/02/16 0003  NA 142 140  K 3.5 3.5  CL 107 105  CO2 28 26  GLUCOSE 108* 150*  BUN 16 17  CREATININE 1.21 1.19  CALCIUM 8.7* 8.5*    Physical Exam: Filed Vitals:   03/02/16 2140 03/03/16 0638  BP: 169/87 167/83  Pulse: 95 95  Temp: 100.1 F (37.8 C) 98.4 F (36.9 C)  Resp: 20 16   General: Alert, no acute distress Cardiovascular: No pedal edema Respiratory: No cyanosis, no use of accessory musculature GI: No organomegaly, abdomen is soft and non-tender Skin: No lesions in the area of chief complaint other than those listed below in MSK exam.  Neurologic: Sensation intact distally save for the below mentioned MSK exam Psychiatric: Patient is competent for consent with normal mood and affect Lymphatic: No axillary or cervical lymphadenopathy  MUSCULOSKELETAL:  Right lower extremity he is very tender over his greater trip bursa with some erythema as well. He has painless small arc Range of motion. He is neurovascularly intact. Other extremities are atraumatic with painless ROM and NVI.  Assessment: Septic bursitis right greater trochanteric  Plan: Surgical I and D with partial IT band excision and bursectomy.   Sheral ApleyMURPHY, Atthew Coutant D, MD Cell [phone]   03/03/2016 3:52  PM

## 2016-03-02 NOTE — Progress Notes (Signed)
[facility] PROGRESS NOTE  Frederick [name] IWO:[id number] DOB: [birth year] DOA: 03/01/2016 PCP: Carmie Kanner, NP  Assessment/Plan: Frederick [name] is a 55 y.o. male with PMH of polysubstance abuse, including tobacco abuse, hearing abuse, cocaine abuse, schizophrenia, HCV, hypertension, who presents with lateral hip pain. Pt reports that he injected heroin into the soft tissue of his right lateral hip area approximate 6 days ago. He developedsevere pain and erythema over this area. He does not have fever or chills. He has nausea, no vomiting or diarrhea. Patient denies chest pain, shortness of breath, cough, symptoms of UTI or unilateral weakness.  Cellulitis of right hip and sepsis: Pt has tenderness, warmth, induration and erythema over right lateral hip area, consistent with cellulitis. Patient meets criteria for sepsis with heart rate>90 and leukocytosis.  -IV fluids.  -Lactate normal.  - Empiric antimicrobial treatment with vancomycin and Zosyn per pharmacy - Blood cultures x 2  - ESR and CRP elevated  -CT of right hip. Ortho consulted.  -PRN IV morphine.   Polysubstance abuse: Including cocaine, tobacco and hearing -Did counseling about importance of quitting substance use -Nicotine patch -UDS positive for cocaine. and HIV antibody pending  HTN: bp=154/82 -hold HCTZ , Vasotec due to sepsis -continue amlodipine  -IV hydralazine when necessary  DVT ppx: SQ Lovenox  Code Status: Full Code.  Family Communication: will update sister. Ms Darci Needle. [phone] Disposition Plan: remain inpatient.    Consultants:  Ortho   Procedures:  none  Antibiotics:  Vancomycin   Zosyn    HPI/Subjective: He is complaining of severe pain right hip, very difficult to move  Objective: Filed Vitals:   03/01/16 2337 03/02/16 0656  BP: 174/87 167/81  Pulse: 94 104  Temp:  99.9 F (37.7 C)  Resp: 20 20    Intake/Output Summary (Last 24 hours) at 03/02/16  9169 Last data filed at 03/02/16 0300  Gross per 24 hour  Intake   2540 ml  Output   1050 ml  Net   1490 ml   Filed Weights   03/01/16 1644  Weight: 95.255 kg (210 lb)    Exam:   General:  Alert in pain  Cardiovascular: S 1, S 2 RRR  Respiratory: CTA  Abdomen: BS present, soft, nt  Musculoskeletal: right hip with edema, redness, very painful to palpation.   Data Reviewed: Basic Metabolic Panel:  Recent Labs Lab 03/01/16 2017 03/02/16 0003  NA 142 140  K 3.5 3.5  CL 107 105  CO2 28 26  GLUCOSE 108* 150*  BUN 16 17  CREATININE 1.21 1.19  CALCIUM 8.7* 8.5*   Liver Function Tests:  Recent Labs Lab 03/02/16 0003  AST 44*  ALT 43  ALKPHOS 79  BILITOT 2.1*  PROT 6.4*  ALBUMIN 3.1*   No results for input(s): LIPASE, AMYLASE in the last 168 hours. No results for input(s): AMMONIA in the last 168 hours. CBC:  Recent Labs Lab 03/01/16 2017 03/02/16 0003  WBC 14.0* 14.1*  NEUTROABS 11.2*  --   HGB 13.7 14.2  HCT 41.8 42.8  MCV 89.9 88.2  PLT 188 191   Cardiac Enzymes: No results for input(s): CKTOTAL, CKMB, CKMBINDEX, TROPONINI in the last 168 hours. BNP (last 3 results) No results for input(s): BNP in the last 8760 hours.  ProBNP (last 3 results) No results for input(s): PROBNP in the last 8760 hours.  CBG: No results for input(s): GLUCAP in the last 168 hours.  No results found for this or any previous  visit (from the past 240 hour(s)).   Studies: Dg Hip Unilat With Pelvis 2-3 Views Right  03/01/2016  CLINICAL DATA:  Right hip pain for 2 days, no known injury, initial encounter EXAM: DG HIP (WITH OR WITHOUT PELVIS) 2-3V RIGHT COMPARISON:  None. FINDINGS: Pelvic ring is intact. No acute fracture or dislocation is noted. No gross soft tissue abnormality is seen. IMPRESSION: No acute abnormality noted. Electronically Signed   By: Inez Catalina M.D.   On: 03/01/2016 20:19    Scheduled Meds: . amLODipine  10 mg Oral Daily  . enalapril  20 mg  Oral Daily  . enoxaparin (LOVENOX) injection  40 mg Subcutaneous QHS  . gabapentin  1,200 mg Oral TID  . nicotine  21 mg Transdermal Daily  . piperacillin-tazobactam (ZOSYN)  IV  3.375 g Intravenous Q8H  . sodium chloride  3,000 mL Intravenous Once  . sodium chloride flush  3 mL Intravenous Q12H  . vancomycin  1,250 mg Intravenous Q12H   Continuous Infusions: . sodium chloride 100 mL/hr at 03/01/16 2315    Principal Problem:   Cellulitis of right hip Active Problems:   Drug abuse   Hypertension   Tobacco abuse   Sepsis ([place])    Time spent: 25 minutes.     Niel Hummer A  [facility] Pager [phone]. If 7PM-7AM, please contact night-coverage at [email or web address], password [facility] 03/02/2016, 9:07 AM  LOS: 1 day

## 2016-03-02 NOTE — Progress Notes (Addendum)
Dr. Orie FishermanMurphey called [facility] that [facility] for procedure of I&D with washing of hip. Verbal order received. Transfer request in place. Unable to transport patient at this time. Dr. Eulah PontMurphy made aware that there needs to be an [facility]. Spoke with Clista BernhardtJoe Perez RN,AC) regarding this situation, unable to transport patient at this time.

## 2016-03-03 ENCOUNTER — Inpatient Hospital Stay (HOSPITAL_COMMUNITY): Payer: MEDICAID

## 2016-03-03 ENCOUNTER — Inpatient Hospital Stay (HOSPITAL_COMMUNITY): Payer: MEDICAID | Admitting: Anesthesiology

## 2016-03-03 ENCOUNTER — Encounter (HOSPITAL_COMMUNITY): Payer: Self-pay | Admitting: Anesthesiology

## 2016-03-03 ENCOUNTER — Encounter (HOSPITAL_COMMUNITY)
Admission: EM | Disposition: A | Discharge status: Home / Self Care | Payer: Self-pay | Source: Home / Self Care | Attending: Internal Medicine

## 2016-03-03 ENCOUNTER — Inpatient Hospital Stay (HOSPITAL_COMMUNITY): Payer: Self-pay | Admitting: Anesthesiology

## 2016-03-03 ENCOUNTER — Inpatient Hospital Stay: Admit: 2016-03-03 | Payer: Self-pay | Admitting: Orthopedic Surgery

## 2016-03-03 HISTORY — PX: INCISION AND DRAINAGE HIP: SHX1801

## 2016-03-03 SURGERY — IRRIGATION AND DEBRIDEMENT HIP
Anesthesia: General | Site: Hip | Laterality: Right

## 2016-03-03 MED ORDER — OXYCODONE HCL 5 MG/5ML PO SOLN: 5.0000 mg | Freq: Once | ORAL | Status: AC | PRN

## 2016-03-03 MED ORDER — FENTANYL CITRATE (PF) 100 MCG/2ML IJ SOLN
INTRAMUSCULAR | Status: AC
  Administered 2016-03-03: 50 ug via INTRAVENOUS
  Filled 2016-03-03: qty 2

## 2016-03-03 MED ORDER — CEFAZOLIN SODIUM-DEXTROSE 2-4 GM/100ML-% IV SOLN
2.0000 g | Freq: Four times a day (QID) | INTRAVENOUS | Status: AC
  Administered 2016-03-03 – 2016-03-04 (×3): 2 g via INTRAVENOUS
  Filled 2016-03-03 (×3): qty 100

## 2016-03-03 MED ORDER — ROCURONIUM BROMIDE 100 MG/10ML IV SOLN
INTRAVENOUS | Status: DC | PRN
  Administered 2016-03-03: 25 mg via INTRAVENOUS

## 2016-03-03 MED ORDER — LACTATED RINGERS IV SOLN
INTRAVENOUS | Status: DC
  Administered 2016-03-03: 50 mL/h via INTRAVENOUS

## 2016-03-03 MED ORDER — HYDROMORPHONE HCL 1 MG/ML IJ SOLN
INTRAMUSCULAR | Status: AC
  Administered 2016-03-03: 0.5 mg via INTRAVENOUS
  Filled 2016-03-03: qty 2

## 2016-03-03 MED ORDER — SUGAMMADEX SODIUM 200 MG/2ML IV SOLN
INTRAVENOUS | Status: DC | PRN
  Administered 2016-03-03: 200 mg via INTRAVENOUS

## 2016-03-03 MED ORDER — FENTANYL CITRATE (PF) 250 MCG/5ML IJ SOLN
INTRAMUSCULAR | Status: AC
  Filled 2016-03-03: qty 5

## 2016-03-03 MED ORDER — STERILE WATER FOR INJECTION IJ SOLN
INTRAMUSCULAR | Status: AC
  Filled 2016-03-03: qty 10

## 2016-03-03 MED ORDER — CEFAZOLIN SODIUM-DEXTROSE 2-3 GM-% IV SOLR
INTRAVENOUS | Status: DC | PRN
  Administered 2016-03-03: 2 g via INTRAVENOUS

## 2016-03-03 MED ORDER — OXYCODONE HCL 5 MG PO TABS
ORAL_TABLET | ORAL | Status: AC
  Administered 2016-03-03: 5 mg via ORAL
  Filled 2016-03-03: qty 1

## 2016-03-03 MED ORDER — SUGAMMADEX SODIUM 200 MG/2ML IV SOLN
INTRAVENOUS | Status: AC
  Filled 2016-03-03: qty 2

## 2016-03-03 MED ORDER — SUCCINYLCHOLINE CHLORIDE 20 MG/ML IJ SOLN
INTRAMUSCULAR | Status: DC | PRN
  Administered 2016-03-03: 140 mg via INTRAVENOUS

## 2016-03-03 MED ORDER — PIPERACILLIN-TAZOBACTAM 3.375 G IVPB
3.3750 g | INTRAVENOUS | Status: AC
  Filled 2016-03-03: qty 50

## 2016-03-03 MED ORDER — MIDAZOLAM HCL 2 MG/2ML IJ SOLN
INTRAMUSCULAR | Status: AC
  Filled 2016-03-03: qty 2

## 2016-03-03 MED ORDER — OXYCODONE HCL 5 MG PO TABS
5.0000 mg | ORAL_TABLET | Freq: Once | ORAL | Status: AC | PRN
  Administered 2016-03-03: 5 mg via ORAL

## 2016-03-03 MED ORDER — SODIUM CHLORIDE 0.9 % IR SOLN
Status: DC | PRN
  Administered 2016-03-03: 3000 mL

## 2016-03-03 MED ORDER — ONDANSETRON HCL 4 MG/2ML IJ SOLN: 4.0000 mg | Freq: Once | INTRAMUSCULAR | Status: DC | PRN

## 2016-03-03 MED ORDER — PROPOFOL 10 MG/ML IV BOLUS
INTRAVENOUS | Status: DC | PRN
  Administered 2016-03-03: 200 mg via INTRAVENOUS

## 2016-03-03 MED ORDER — MIDAZOLAM HCL 5 MG/5ML IJ SOLN
INTRAMUSCULAR | Status: DC | PRN
  Administered 2016-03-03: 4 mg via INTRAVENOUS

## 2016-03-03 MED ORDER — 0.9 % SODIUM CHLORIDE (POUR BTL) OPTIME
TOPICAL | Status: DC | PRN
  Administered 2016-03-03: 1000 mL

## 2016-03-03 MED ORDER — FENTANYL CITRATE (PF) 100 MCG/2ML IJ SOLN
25.0000 ug | INTRAMUSCULAR | Status: DC | PRN
  Administered 2016-03-03 (×3): 50 ug via INTRAVENOUS

## 2016-03-03 MED ORDER — FENTANYL CITRATE (PF) 100 MCG/2ML IJ SOLN
INTRAMUSCULAR | Status: DC | PRN
  Administered 2016-03-03: 150 ug via INTRAVENOUS
  Administered 2016-03-03: 100 ug via INTRAVENOUS
  Administered 2016-03-03: 50 ug via INTRAVENOUS
  Administered 2016-03-03 (×2): 100 ug via INTRAVENOUS

## 2016-03-03 MED ORDER — HYDRALAZINE HCL 20 MG/ML IJ SOLN
INTRAMUSCULAR | Status: AC
  Filled 2016-03-03: qty 1

## 2016-03-03 MED ORDER — HYDROMORPHONE HCL 1 MG/ML IJ SOLN
0.5000 mg | INTRAMUSCULAR | Status: DC | PRN
  Administered 2016-03-03 (×2): 0.5 mg via INTRAVENOUS

## 2016-03-03 MED ORDER — LIDOCAINE HCL (CARDIAC) 20 MG/ML IV SOLN
INTRAVENOUS | Status: DC | PRN
  Administered 2016-03-03: 50 mg via INTRAVENOUS

## 2016-03-03 SURGICAL SUPPLY — 45 items
CLSR STERI-STRIP ANTIMIC 1/2X4 (GAUZE/BANDAGES/DRESSINGS) ×4 IMPLANT
COVER SURGICAL LIGHT HANDLE (MISCELLANEOUS) ×2 IMPLANT
DRAIN HEMOVAC 7FR (DRAIN) ×2 IMPLANT
DRAPE IMP U-DRAPE 54X76 (DRAPES) ×2 IMPLANT
DRAPE ORTHO SPLIT 77X108 STRL (DRAPES) ×2
DRAPE SURG ORHT 6 SPLT 77X108 (DRAPES) ×2 IMPLANT
DRAPE U-SHAPE 47X51 STRL (DRAPES) ×2 IMPLANT
DRSG AQUACEL AG ADV 3.5X10 (GAUZE/BANDAGES/DRESSINGS) ×2 IMPLANT
DRSG MEPILEX BORDER 4X4 (GAUZE/BANDAGES/DRESSINGS) ×2 IMPLANT
DRSG MEPILEX BORDER 4X8 (GAUZE/BANDAGES/DRESSINGS) ×2 IMPLANT
DURAPREP 26ML APPLICATOR (WOUND CARE) ×2 IMPLANT
ELECT CAUTERY BLADE 6.4 (BLADE) ×2 IMPLANT
ELECT REM PT RETURN 9FT ADLT (ELECTROSURGICAL) ×2
ELECTRODE REM PT RTRN 9FT ADLT (ELECTROSURGICAL) ×1 IMPLANT
FACESHIELD WRAPAROUND (MASK) ×2 IMPLANT
GLOVE BIO SURGEON STRL SZ7 (GLOVE) ×2 IMPLANT
GLOVE BIO SURGEON STRL SZ7.5 (GLOVE) IMPLANT
GLOVE BIOGEL PI IND STRL 7.0 (GLOVE) ×1 IMPLANT
GLOVE BIOGEL PI IND STRL 8 (GLOVE) ×1 IMPLANT
GLOVE BIOGEL PI INDICATOR 7.0 (GLOVE) ×1
GLOVE BIOGEL PI INDICATOR 8 (GLOVE) ×1
GOWN STRL REUS W/ TWL LRG LVL3 (GOWN DISPOSABLE) ×1 IMPLANT
GOWN STRL REUS W/ TWL XL LVL3 (GOWN DISPOSABLE) ×1 IMPLANT
GOWN STRL REUS W/TWL LRG LVL3 (GOWN DISPOSABLE) ×1
GOWN STRL REUS W/TWL XL LVL3 (GOWN DISPOSABLE) ×2
HANDPIECE INTERPULSE COAX TIP (DISPOSABLE) ×1
KIT BASIN OR (CUSTOM PROCEDURE TRAY) ×2 IMPLANT
KIT ROOM TURNOVER OR (KITS) ×2 IMPLANT
MANIFOLD NEPTUNE II (INSTRUMENTS) ×2 IMPLANT
NS IRRIG 1000ML POUR BTL (IV SOLUTION) ×2 IMPLANT
PACK TOTAL JOINT (CUSTOM PROCEDURE TRAY) ×2 IMPLANT
PACK UNIVERSAL I (CUSTOM PROCEDURE TRAY) ×2 IMPLANT
PAD ARMBOARD 7.5X6 YLW CONV (MISCELLANEOUS) ×4 IMPLANT
SET HNDPC FAN SPRY TIP SCT (DISPOSABLE) ×1 IMPLANT
SUT ETHILON 3 0 PS 1 (SUTURE) ×4 IMPLANT
SUT FIBERWIRE #2 38 REV NDL BL (SUTURE)
SUT MNCRL AB 4-0 PS2 18 (SUTURE) ×2 IMPLANT
SUT MON AB 2-0 CT1 36 (SUTURE) ×2 IMPLANT
SUT VIC AB 0 CT1 27 (SUTURE) ×1
SUT VIC AB 0 CT1 27XBRD ANBCTR (SUTURE) ×1 IMPLANT
SUTURE FIBERWR#2 38 REV NDL BL (SUTURE) IMPLANT
TOWEL OR 17X24 6PK STRL BLUE (TOWEL DISPOSABLE) ×2 IMPLANT
TOWEL OR 17X26 10 PK STRL BLUE (TOWEL DISPOSABLE) ×2 IMPLANT
TOWEL OR NON WOVEN STRL DISP B (DISPOSABLE) ×2 IMPLANT
WATER STERILE IRR 1000ML POUR (IV SOLUTION) ×4 IMPLANT

## 2016-03-03 NOTE — Anesthesia Preprocedure Evaluation (Addendum)
Anesthesia Evaluation  Patient identified by MRN, date of birth, ID band Patient awake    Reviewed: Allergy & Precautions, NPO status , Patient's Chart, lab work & pertinent test results, reviewed documented beta blocker date and time   Airway Mallampati: II  TM Distance: >3 FB     Dental  (+) Teeth Intact, Dental Advisory Given   Pulmonary Current Smoker,    breath sounds clear to auscultation       Cardiovascular hypertension, Pt. on medications  Rhythm:Regular Rate:Normal     Neuro/Psych Schizophrenia    GI/Hepatic (+) Hepatitis -, C  Endo/Other    Renal/GU      Musculoskeletal   Abdominal   Peds  Hematology   Anesthesia Other Findings   Reproductive/Obstetrics                            Anesthesia Physical Anesthesia Plan  ASA: III  Anesthesia Plan: General   Post-op Pain Management:    Induction: Intravenous  Airway Management Planned: Oral ETT  Additional Equipment:   Intra-op Plan:   Post-operative Plan: Extubation in OR  Informed Consent: I have reviewed the patients History and Physical, chart, labs and discussed the procedure including the risks, benefits and alternatives for the proposed anesthesia with the patient or authorized representative who has indicated his/her understanding and acceptance.   Dental advisory given  Plan Discussed with: CRNA and Anesthesiologist  Anesthesia Plan Comments:         Anesthesia Quick Evaluation

## 2016-03-03 NOTE — Anesthesia Postprocedure Evaluation (Signed)
Anesthesia Post Note  Patient: Leone BrandMichael A Comella  Procedure(s) Performed: Procedure(s) (LRB): IRRIGATION AND DEBRIDEMENT RIGHT HIP BURSA (Right)  Patient location during evaluation: PACU Anesthesia Type: General Level of consciousness: awake and alert Pain management: pain level controlled Vital Signs Assessment: post-procedure vital signs reviewed and stable Respiratory status: spontaneous breathing, nonlabored ventilation and respiratory function stable Cardiovascular status: blood pressure returned to baseline and stable Postop Assessment: no signs of nausea or vomiting Anesthetic complications: no    Last Vitals:  Filed Vitals:   03/03/16 1830 03/03/16 1845  BP:  166/90  Pulse: 87 96  Temp:    Resp: 15 22    Last Pain:  Filed Vitals:   03/03/16 1848  PainSc: 10-Worst pain ever                 Erickson Yamashiro A

## 2016-03-03 NOTE — Progress Notes (Signed)
[facility]  [facility], to undergo I and D.  Patient stable for transfer.  Bed request made for 5 north.

## 2016-03-03 NOTE — Transfer of Care (Signed)
Immediate Anesthesia Transfer of Care Note  Patient: Frederick [name]  Procedure(s) Performed: Procedure(s): IRRIGATION AND DEBRIDEMENT RIGHT HIP BURSA (Right)  Patient Location: PACU  Anesthesia Type:General  Level of Consciousness: awake, alert , oriented and patient cooperative  Airway & Oxygen Therapy: Patient Spontanous Breathing and Patient connected to nasal cannula oxygen  Post-op Assessment: Report given to RN and Post -op Vital signs reviewed and stable  Post vital signs: Reviewed and stable  Last Vitals:  Filed Vitals:   03/03/16 0638 03/03/16 1745  BP: 167/83 157/96  Pulse: 95 84  Temp: 36.9 C 36.8 C  Resp: 16 16    Complications: No apparent anesthesia complications

## 2016-03-03 NOTE — Consult Note (Signed)
I have reviewed his imaging and case. Septic bursitis on R hip  [facility]. Will cancel Aspiration and perform I&D instead.  Formal c/s to follow    Oktober Glazer D

## 2016-03-03 NOTE — Anesthesia Procedure Notes (Addendum)
Procedure Name: Intubation Date/Time: 03/03/2016 4:24 PM Performed by: Orvilla FusATO, SARAH A Pre-anesthesia Checklist: Patient identified, Timeout performed, Emergency Drugs available, Suction available and Patient being monitored Patient Re-evaluated:Patient Re-evaluated prior to inductionOxygen Delivery Method: Circle system utilized Preoxygenation: Pre-oxygenation with 100% oxygen Intubation Type: IV induction Ventilation: Mask ventilation without difficulty Laryngoscope Size: Mac and 4 Grade View: Grade I Tube type: Oral Tube size: 7.5 mm Number of attempts: 1 Airway Equipment and Method: Stylet Placement Confirmation: ETT inserted through vocal cords under direct vision,  breath sounds checked- equal and bilateral and positive ETCO2 Secured at: 23 cm Tube secured with: Tape Dental Injury: Teeth and Oropharynx as per pre-operative assessment     Central Venous Catheter Insertion Performed by: anesthesiologist Patient location: Pre-op. Preanesthetic checklist: patient identified, IV checked, site marked, risks and benefits discussed, surgical consent, monitors and equipment checked, pre-op evaluation, timeout performed and anesthesia consent Lidocaine 1% used for infiltration Landmarks identified Catheter size: 8 Fr Central line was placed.Double lumen Procedure performed using ultrasound guided technique. Attempts: 1 Following insertion, dressing applied and line sutured. Post procedure assessment: blood return through all ports. Patient tolerated the procedure well with no immediate complications.

## 2016-03-03 NOTE — Op Note (Signed)
03/01/2016 - 03/03/2016  4:52 PM  PATIENT:  Frederick [name]    PRE-OPERATIVE DIAGNOSIS:  septic bursitis right hip  POST-OPERATIVE DIAGNOSIS:  Same  PROCEDURE:  IRRIGATION AND DEBRIDEMENT RIGHT HIP BURSA  SURGEON:  [name], [name], MD  ASSISTANT: Janace LittenBrandon Parry, OPA-C, present and scrubbed throughout the case, critical for completion in a timely fashion, and for retraction, instrumentation, and closure.   ANESTHESIA:   gen  PREOPERATIVE INDICATIONS:  Frederick [name] is a  [age] y.o. male with a diagnosis of septic bursitis right hip who failed conservative measures and elected for surgical management.    The risks benefits and alternatives were discussed with the patient preoperatively including but not limited to the risks of infection, bleeding, nerve injury, cardiopulmonary complications, the need for revision surgery, among others, and the patient was willing to proceed.  OPERATIVE IMPLANTS: none  OPERATIVE FINDINGS: Purulent fluid in bursal region  BLOOD LOSS: 50  COMPLICATIONS: none  TOURNIQUET TIME: none  OPERATIVE PROCEDURE:  Patient was identified in the preoperative holding area and site was marked by me He was transported to the operating theater and placed on the table in supine position taking care to pad all bony prominences. After a preincinduction time out anesthesia was induced. The right later thigh was prepped and draped in normal sterile fashion and a pre-incision timeout was performed. He received ancef for preoperative antibiotics.   His and placed in the lateral position again padding all bony prominences. Right lower extremity was so the right lateral thigh was prepped and draped in normal sterile fashion. I made a longitudinal incision over his stroke bursa. There is significant edema but no purulence down to his IT band and incise this in line in line with the excision resecting central portion of the IT band medially noted significant amount of purulent  fluid. This was sent to the lab for culture.  I then carried this distally and proximally I noted a few other pockets once as satisfied that I probed and developed all areas appear fluid I debrided all necrotic tissue sharply with scissors scalpel and Ronjair.  I then performed a bursectomy. I excised the central portion of his IT band to allow for continued drainage if needed  I then irrigated his wound with 3 L of saline. I placed a Hemovac drain I loosely closed the set skin to allow for continued drainage if needed.  Sterile dressing was then applied he was awoken and taken the PACU in stable condition  POST OPERATIVE PLAN: weightbearing as tolerated antibiotics per primary    This note was generated using a template and dragon dictation system. In light of that, I have reviewed the note and all aspects of it are applicable to this case. Any dictation errors are due to the computerized dictation system.

## 2016-03-03 NOTE — Progress Notes (Signed)
[facility] PROGRESS NOTE  JAIME GRIZZELL BWL:[id number] DOB: Jan 25, 1961 DOA: 03/01/2016 PCP: Carmie Kanner, NP  Assessment/Plan: Frederick [name] is a 55 y.o. male with PMH of polysubstance abuse, including tobacco abuse, hearing abuse, cocaine abuse, schizophrenia, HCV, hypertension, who presents with lateral hip pain. Pt reports that he injected heroin into the soft tissue of his right lateral hip area approximate 6 days ago. He developedsevere pain and erythema over this area. He does not have fever or chills. He has nausea, no vomiting or diarrhea. Patient denies chest pain, shortness of breath, cough, symptoms of UTI or unilateral weakness.  Cellulitis of right hip and sepsis: Pt has tenderness, warmth, induration and erythema over right lateral hip area. Patient meets criteria for sepsis with heart rate>90 and leukocytosis.  -CT right hip showed peritrochanteric fluid collection on the right consistent with trochanteric bursitis.  -IV fluids.  -Lactate normal.  -Continue with vancomycin and Zosyn per pharmacy - Blood cultures x 2  - ESR and CRP elevated  - Ortho consulted. Dr Percell Miller following, plan to transfer to [facility] for I and D.  -PRN IV morphine.   Polysubstance abuse: Including cocaine, tobacco and hearing -Did counseling about importance of quitting substance use -Nicotine patch -UDS positive for cocaine. and HIV antibody non reactive  HTN: bp=154/82 -hold HCTZ , Vasotec due to sepsis -continue amlodipine  -IV hydralazine when necessary  Transaminases: follow trend. Repeat labs in am. If persist might need [place].    DVT ppx: SQ Lovenox  Code Status: Full Code.  Family Communication updated sister:  Ms Darci Needle. [phone] Disposition Plan: remain inpatient.    Consultants:  Ortho   Procedures:  none  Antibiotics:  Vancomycin   Zosyn    HPI/Subjective: He is complaining of severe pain right hip, very difficult to  move  Objective: Filed Vitals:   03/02/16 2140 03/03/16 0638  BP: 169/87 167/83  Pulse: 95 95  Temp: 100.1 F (37.8 C) 98.4 F (36.9 C)  Resp: 20 16    Intake/Output Summary (Last 24 hours) at 03/03/16 0853 Last data filed at 03/03/16 0601  Gross per 24 hour  Intake 4265.83 ml  Output   3000 ml  Net 1265.83 ml   Filed Weights   03/01/16 1644  Weight: 95.255 kg (210 lb)    Exam:   General:  Alert in pain  Cardiovascular: S 1, S 2 RRR  Respiratory: CTA  Abdomen: BS present, soft, nt  Musculoskeletal: right hip with edema, redness, very painful to palpation.   Data Reviewed: Basic Metabolic Panel:  Recent Labs Lab 03/01/16 2017 03/02/16 0003  NA 142 140  K 3.5 3.5  CL 107 105  CO2 28 26  GLUCOSE 108* 150*  BUN 16 17  CREATININE 1.21 1.19  CALCIUM 8.7* 8.5*   Liver Function Tests:  Recent Labs Lab 03/02/16 0003  AST 44*  ALT 43  ALKPHOS 79  BILITOT 2.1*  PROT 6.4*  ALBUMIN 3.1*   No results for input(s): LIPASE, AMYLASE in the last 168 hours. No results for input(s): AMMONIA in the last 168 hours. CBC:  Recent Labs Lab 03/01/16 2017 03/02/16 0003  WBC 14.0* 14.1*  NEUTROABS 11.2*  --   HGB 13.7 14.2  HCT 41.8 42.8  MCV 89.9 88.2  PLT 188 191   Cardiac Enzymes: No results for input(s): CKTOTAL, CKMB, CKMBINDEX, TROPONINI in the last 168 hours. BNP (last 3 results) No results for input(s): BNP in the last 8760 hours.  ProBNP (  last 3 results) No results for input(s): PROBNP in the last 8760 hours.  CBG: No results for input(s): GLUCAP in the last 168 hours.  No results found for this or any previous visit (from the past 240 hour(s)).   Studies: Ct Hip Right W Contrast  03/02/2016  CLINICAL DATA:  Right lateral hip pain after recent drug injection approximately 6 days ago. Leukocytosis. EXAM: CT OF THE RIGHT HIP WITH CONTRAST TECHNIQUE: Multidetector CT imaging was performed following the standard protocol during bolus  administration of intravenous contrast. CONTRAST:  139m ISOVUE-300 IOPAMIDOL (ISOVUE-300) INJECTION 61% COMPARISON:  Radiographs 03/01/2016. FINDINGS: No evidence of acute fracture or dislocation. There is no evidence of femoral head avascular necrosis. The right hip joint space is maintained. The visualized right sacroiliac joint demonstrates no acute findings, although may be partially ankylosed. Motion artifact accounts for irregularity of the mid sacrum on the reformatted views. Lateral to the right greater trochanter, there is an ill-defined fluid collection which demonstrates low level peripheral enhancement. This measures approximately 13.5 x 2.6 x 4.4 cm. No foreign body or soft tissue emphysema is identified in this area. The overlying subcutaneous fat demonstrates soft tissue stranding without other focal fluid collection or soft tissue emphysema. No intramuscular fluid collections are seen. There is no significant right hip joint effusion. No intrapelvic inflammatory changes or fluid collections are seen. Bladder wall thickening is probably related to limited bladder distention. IMPRESSION: 1. Peritrochanteric fluid collection on the right consistent with trochanteric bursitis. Given the patient's history, this is potentially infected. Fluid sampling/drainage should be considered. 2. Overlying soft tissue stranding in the subcutaneous fat lateral to the right hip consistent with cellulitis. 3. No evidence of deep abscess, septic joint or osteomyelitis. Electronically Signed   By: WRichardean SaleM.D.   On: 03/02/2016 18:48   Dg Hip Unilat With Pelvis 2-3 Views Right  03/01/2016  CLINICAL DATA:  Right hip pain for 2 days, no known injury, initial encounter EXAM: DG HIP (WITH OR WITHOUT PELVIS) 2-3V RIGHT COMPARISON:  None. FINDINGS: Pelvic ring is intact. No acute fracture or dislocation is noted. No gross soft tissue abnormality is seen. IMPRESSION: No acute abnormality noted. Electronically Signed    By: MInez CatalinaM.D.   On: 03/01/2016 20:19    Scheduled Meds: . amLODipine  10 mg Oral Daily  . enoxaparin (LOVENOX) injection  40 mg Subcutaneous QHS  . gabapentin  1,200 mg Oral TID  . nicotine  21 mg Transdermal Daily  . piperacillin-tazobactam (ZOSYN)  IV  3.375 g Intravenous Q8H  . sodium chloride  3,000 mL Intravenous Once  . sodium chloride flush  3 mL Intravenous Q12H  . vancomycin  1,250 mg Intravenous Q12H   Continuous Infusions: . sodium chloride 125 mL/hr at 03/03/16 0601    Principal Problem:   Cellulitis of right hip Active Problems:   Drug abuse   Hypertension   Tobacco abuse   Sepsis (HGirard   Cellulitis    Time spent: 25 minutes.     RNiel HummerA  [facility] Pager [phone] If 7PM-7AM, please contact night-coverage at [email or web address], password [email or web address]4/13/2017, 8:53 AM  LOS: 2 days

## 2016-03-04 DIAGNOSIS — A419 Sepsis, unspecified organism: Secondary | ICD-10-CM

## 2016-03-04 DIAGNOSIS — L03115 Cellulitis of right lower limb: Secondary | ICD-10-CM

## 2016-03-04 DIAGNOSIS — F1910 Other psychoactive substance abuse, uncomplicated: Secondary | ICD-10-CM

## 2016-03-04 DIAGNOSIS — I10 Essential (primary) hypertension: Secondary | ICD-10-CM

## 2016-03-04 LAB — CBC
HEMATOCRIT: 42.6 % (ref 39.0–52.0)
Hemoglobin: 13.9 g/dL (ref 13.0–17.0)
MCH: 28.7 pg (ref 26.0–34.0)
MCHC: 32.6 g/dL (ref 30.0–36.0)
MCV: 87.8 fL (ref 78.0–100.0)
Platelets: 276 10*3/uL (ref 150–400)
RBC: 4.85 MIL/uL (ref 4.22–5.81)
RDW: 13.2 % (ref 11.5–15.5)
WBC: 10.5 10*3/uL (ref 4.0–10.5)

## 2016-03-04 LAB — COMPREHENSIVE METABOLIC PANEL
ALBUMIN: 2.4 g/dL — AB (ref 3.5–5.0)
ALT: 33 U/L (ref 17–63)
AST: 47 U/L — AB (ref 15–41)
Alkaline Phosphatase: 67 U/L (ref 38–126)
Anion gap: 11 (ref 5–15)
BILIRUBIN TOTAL: 1.4 mg/dL — AB (ref 0.3–1.2)
BUN: 12 mg/dL (ref 6–20)
CO2: 25 mmol/L (ref 22–32)
CREATININE: 0.97 mg/dL (ref 0.61–1.24)
Calcium: 8 mg/dL — ABNORMAL LOW (ref 8.9–10.3)
Chloride: 102 mmol/L (ref 101–111)
GFR calc Af Amer: >60 mL/min (ref >60)
GLUCOSE: 128 mg/dL — AB (ref 65–99)
POTASSIUM: 3.4 mmol/L — AB (ref 3.5–5.1)
Sodium: 138 mmol/L (ref 135–145)
TOTAL PROTEIN: 6.2 g/dL — AB (ref 6.5–8.1)

## 2016-03-04 MED ORDER — KETOROLAC TROMETHAMINE 30 MG/ML IJ SOLN
30.0000 mg | Freq: Four times a day (QID) | INTRAMUSCULAR | Status: DC
  Administered 2016-03-04 – 2016-03-06 (×9): 30 mg via INTRAVENOUS
  Filled 2016-03-04 (×9): qty 1

## 2016-03-04 MED ORDER — TRAMADOL HCL 50 MG PO TABS
50.0000 mg | ORAL_TABLET | Freq: Four times a day (QID) | ORAL | Status: DC | PRN
  Administered 2016-03-04 – 2016-03-06 (×7): 50 mg via ORAL
  Filled 2016-03-04 (×7): qty 1

## 2016-03-04 MED ORDER — SENNOSIDES-DOCUSATE SODIUM 8.6-50 MG PO TABS
1.0000 | ORAL_TABLET | Freq: Two times a day (BID) | ORAL | Status: DC
Start: 2016-03-04 — End: 2016-03-05
  Administered 2016-03-04 – 2016-03-05 (×2): 1 via ORAL
  Filled 2016-03-04 (×2): qty 1

## 2016-03-04 MED ORDER — CYCLOBENZAPRINE HCL 10 MG PO TABS
5.0000 mg | ORAL_TABLET | Freq: Three times a day (TID) | ORAL | Status: DC
  Administered 2016-03-04 – 2016-03-06 (×5): 5 mg via ORAL
  Filled 2016-03-04 (×5): qty 1

## 2016-03-04 MED ORDER — OXYCODONE HCL 5 MG PO TABS
5.0000 mg | ORAL_TABLET | Freq: Once | ORAL | Status: AC
  Administered 2016-03-04: 5 mg via ORAL
  Filled 2016-03-04: qty 1

## 2016-03-04 MED ORDER — POLYETHYLENE GLYCOL 3350 17 G PO PACK
17.0000 g | PACK | Freq: Two times a day (BID) | ORAL | Status: DC
  Administered 2016-03-04 – 2016-03-06 (×3): 17 g via ORAL
  Filled 2016-03-04 (×4): qty 1

## 2016-03-04 NOTE — Progress Notes (Signed)
Subjective: 1 Day Post-Op Procedure(s) (LRB): IRRIGATION AND DEBRIDEMENT RIGHT HIP BURSA (Right) Patient reports pain as severe.  Biggest complaint is pain.  No nausea/vomiting, lightheadedness/dizziness, chest pain/sob.  Objective: Vital signs in last 24 hours: Temp:  [98.1 F (36.7 C)-98.7 F (37.1 C)] 98.1 F (36.7 C) (04/14 0500) Pulse Rate:  [79-96] 94 (04/14 0500) Resp:  [15-23] 18 (04/14 0500) BP: (157-174)/(80-102) 168/80 mmHg (04/14 0648) SpO2:  [96 %-100 %] 99 % (04/14 0500)  Intake/Output from previous day: 04/13 0701 - 04/14 0700 In: 800 [I.V.:800] Out: 1850 [Urine:1750; Drains:100] Intake/Output this shift:     Recent Labs  03/01/16 2017 03/02/16 0003 03/04/16 0602  HGB 13.7 14.2 13.9    Recent Labs  03/02/16 0003 03/04/16 0602  WBC 14.1* 10.5  RBC 4.85 4.85  HCT 42.8 42.6  PLT 191 276    Recent Labs  03/02/16 0003 03/04/16 0602  NA 140 138  K 3.5 3.4*  CL 105 102  CO2 26 25  BUN 17 12  CREATININE 1.19 0.97  GLUCOSE 150* 128*  CALCIUM 8.5* 8.0*    Recent Labs  03/01/16 2247  INR 1.16    Neurologically intact Neurovascular intact Sensation intact distally Intact pulses distally Dorsiflexion/Plantar flexion intact Compartment soft  Drain still actively draining  Assessment/Plan: 1 Day Post-Op Procedure(s) (LRB): IRRIGATION AND DEBRIDEMENT RIGHT HIP BURSA (Right) Advance diet  WBAT RLE Will defer pain management to medicine service Dry dressing change prn WBAT RLE  Otilio SaberM Lindsey Clydell Alberts 03/04/2016, 10:36 AM

## 2016-03-04 NOTE — Progress Notes (Signed)
[facility] Progress Note  Patient: Frederick [name] MRN:[id number]   PCP: PLACEY,MARY H, NP DOB: 10/03/1961   DOA: 03/01/2016   DOS: 03/04/2016   Date of Service: the patient was seen and examined on 03/04/2016  Subjective: The patient complains of significant pain in her right thigh. Denies having any complaint of chest pain or abdominal pain located*nausea. Also complains of shortness of breath. No fever no chills. While the patient is complaining of severe pain 10 out of 10, his heart rate is 80 and saturation is 100% Nutrition: Tolerating oral diet  Brief hospital course: Patient was admitted on 03/01/2016, with past medical history of polysubstance abuse, schizophrenia, axillary, hypertension, presented with complaint of right hip pain, the patient injected heroin in his right hip approximately 6 days ago before admission, was found to have right hip cellulitis with sepsis. Initially admitted at [facility]. After discussion with orthopedic the patient was transferred to [facility] for I&D. The patient underwent the procedure on 03/03/2016 Currently further plan is continue broad-spectrum antibiotics, await culture and sensitivity.  Assessment and Plan: 1. Cellulitis of right hip , trochanteric bursitis Initially met sepsis criteria with leukocytosis and tachycardia. Also has elevation of bilirubin and evidence of dehydration with elevated creatinine Culture from the wound is growing GRAM POSITIVE COCCI IN PAIRS CRP 13.2 HIV nonreactive, blood cultures negative for 2 days. Initially started on vancomycin and Zosyn. Given the culture I would continue the antibiotics. Patient appears to be gradually improving. PTOT will be consulted.  2. Polysubstance abuse. Pain management. The patient is complaining of significant pain on this morning but does not appear to be any distress clinically. Patient was sleeping when I arrived in the room and woke up and started  complaining of pain. At present I would avoid any IV narcotics in this patient with his history of polysubstance abuse. Scheduled Toradol is ordered. I would also add Flexeril for muscle spasm. Continue when necessary tramadol.  3. Neuropathy. Patient is on 1200 mg gabapentin 3 times daily at home. At present I would continue and monitor clinically.  4. Essential hypertension. Patient is on 3 antihypertensive medications at home. Continue amlodipine at present.  Activity: physical therapy ordered Bowel regimen: last BM 03/01/2016 Senokot and MiraLAX ordered DVT Prophylaxis: subcutaneous Heparin Nutrition: Regular diet Advance goals of care discussion: Full code  Procedures: Incision and drainage of the right trochanteric bursitis Consultants: Orthopedics Antibiotics: Anti-infectives    Start     Dose/Rate Route Frequency Ordered Stop   03/03/16 2230  ceFAZolin (ANCEF) IVPB 2g/100 mL premix     2 g 200 mL/hr over 30 Minutes Intravenous Every 6 hours 03/03/16 1952 03/04/16 1028   03/03/16 1545  piperacillin-tazobactam (ZOSYN) IVPB 3.375 g     3.375 g 12.5 mL/hr over 240 Minutes Intravenous To Surgery 03/03/16 1531 03/04/16 1545   03/02/16 1100  vancomycin (VANCOCIN) 1,250 mg in sodium chloride 0.9 % 250 mL IVPB     1,250 mg 166.7 mL/hr over 90 Minutes Intravenous Every 12 hours 03/01/16 2201     03/02/16 0700  piperacillin-tazobactam (ZOSYN) IVPB 3.375 g     3.375 g 12.5 mL/hr over 240 Minutes Intravenous Every 8 hours 03/01/16 2220     03/01/16 2230  piperacillin-tazobactam (ZOSYN) IVPB 3.375 g     3.375 g 100 mL/hr over 30 Minutes Intravenous STAT 03/01/16 2219 03/02/16 0139   03/01/16 2200  vancomycin (VANCOCIN) 1,250 mg in sodium chloride 0.9 % 250 mL IVPB       1,250 mg 166.7 mL/hr over 90 Minutes Intravenous STAT 03/01/16 2155 03/02/16 0045       Family Communication: no family was present at bedside, at the time of interview.   Disposition:  Expected discharge  date: 03/06/2016 Barriers to safe discharge: Culture and physical therapy consult   Intake/Output Summary (Last 24 hours) at 03/04/16 1812 Last data filed at 03/04/16 1300  Gross per 24 hour  Intake      0 ml  Output   4550 ml  Net  -4550 ml   Filed Weights   03/01/16 1644  Weight: 95.255 kg (210 lb)    Objective: Physical Exam: Filed Vitals:   03/03/16 2300 03/04/16 0500 03/04/16 0648 03/04/16 1434  BP: 170/88 174/80 168/80 130/66  Pulse: 86 94  90  Temp: 98.5 F (36.9 C) 98.1 F (36.7 C)  98.1 F (36.7 C)  TempSrc: Oral Oral  Oral  Resp: _0 Height:      Weight:      SpO2: 97% 99%  100%     General: Appear in mild distress, no Rash; Oral Mucosa moist. Cardiovascular: S1 and S2 Present, no Murmur,  Respiratory: Bilateral Air entry present and Clear to Auscultation, no Crackles, no wheezes Abdomen: Bowel Sound present, Soft and no tenderness Extremities: no Pedal edema, no calf tenderness Neurology: Grossly no focal neuro deficit.  Data Reviewed: CBC:  Recent Labs Lab 03/01/16 2017 03/02/16 0003 03/04/16 0602  WBC 14.0* 14.1* 10.5  NEUTROABS 11.2*  --   --   HGB 13.7 14.2 13.9  HCT 41.8 42.8 42.6  MCV 89.9 88.2 87.8  PLT 188 191 431   Basic Metabolic Panel:  Recent Labs Lab 03/01/16 2017 03/02/16 0003 03/04/16 0602  NA 142 140 138  K 3.5 3.5 3.4*  CL 107 105 102  CO2 _1 GLUCOSE 108* 150* 128*  BUN _2 CREATININE 1.21 1.19 0.97  CALCIUM 8.7* 8.5* 8.0*   Liver Function Tests:  Recent Labs Lab 03/02/16 0003 03/04/16 0602  AST 44* 47*  ALT 43 33  ALKPHOS 79 67  BILITOT 2.1* 1.4*  PROT 6.4* 6.2*  ALBUMIN 3.1* 2.4*   No results for input(s): LIPASE, AMYLASE in the last 168 hours. No results for input(s): AMMONIA in the last 168 hours.  Cardiac Enzymes: No results for input(s): CKTOTAL, CKMB, CKMBINDEX, TROPONINI in the last 168 hours.  BNP (last 3 results) No results for input(s): BNP in the last 8760  hours.  CBG: No results for input(s): GLUCAP in the last 168 hours.  Recent Results (from the past 240 hour(s))  Culture, blood (x 2)     Status: None (Preliminary result)   Collection Time: 03/01/16 11:46 PM  Result Value Ref Range Status   Specimen Description BLOOD LEFT ANTECUBITAL  Final   Special Requests IN PEDIATRIC BOTTLE 1ML  Final   Culture   Final    NO GROWTH 2 DAYS Performed at [facility]    Report Status PENDING  Incomplete  Culture, blood (x 2)     Status: None (Preliminary result)   Collection Time: 03/01/16 11:46 PM  Result Value Ref Range Status   Specimen Description BLOOD LEFT HAND  Final   Special Requests IN PEDIATRIC BOTTLE 1ML  Final   Culture   Final    NO GROWTH 2 DAYS Performed at [facility]    Report Status PENDING  Incomplete  Culture, routine-abscess  Status: None (Preliminary result)   Collection Time: 03/03/16  4:47 PM  Result Value Ref Range Status   Specimen Description ABSCESS  Final   Special Requests RIGHT TROCHANTER PT ON ANCEF AND ZOSYN  Final   Gram Stain   Final    ABUNDANT WBC PRESENT, PREDOMINANTLY PMN NO SQUAMOUS EPITHELIAL CELLS SEEN FEW GRAM POSITIVE COCCI IN PAIRS Performed at [facility]    Culture PENDING  Incomplete   Report Status PENDING  Incomplete     Studies: Dg Chest Port 1 View  03/03/2016  CLINICAL DATA:  Encounter for RIGHT central line placement EXAM: PORTABLE CHEST 1 VIEW COMPARISON:  Portable exam 1808 hours without priors for comparison. FINDINGS: RIGHT jugular central venous catheter with tip projecting over SVC. Upper normal heart size. Mediastinal contours and pulmonary vascularity normal. Lungs clear. No pleural effusion or pneumothorax. Bones unremarkable. IMPRESSION: No pneumothorax following RIGHT jugular line placement. Electronically Signed   By: Lavonia Dana M.D.   On: 03/03/2016 18:30     Scheduled Meds: . amLODipine  10 mg Oral Daily  . cyclobenzaprine  5 mg Oral  TID  . enoxaparin (LOVENOX) injection  40 mg Subcutaneous QHS  . gabapentin  1,200 mg Oral TID  . ketorolac  30 mg Intravenous 4 times per day  . nicotine  21 mg Transdermal Daily  . piperacillin-tazobactam (ZOSYN)  IV  3.375 g Intravenous Q8H  . sodium chloride  3,000 mL Intravenous Once  . sodium chloride flush  3 mL Intravenous Q12H  . vancomycin  1,250 mg Intravenous Q12H   Continuous Infusions: . sodium chloride 125 mL/hr at 03/03/16 2207  . lactated ringers 50 mL/hr (03/03/16 1159)   PRN Meds: acetaminophen **OR** acetaminophen, hydrALAZINE, LORazepam, ondansetron **OR** ondansetron (ZOFRAN) IV  Time spent: 30 minutes  Author: Berle Mull, MD [facility] Pager: [phone] 03/04/2016 6:12 PM  If 7PM-7AM, please contact night-coverage at [email or web address], password [facility]

## 2016-03-04 NOTE — Progress Notes (Signed)
Pharmacy Antibiotic Note  Frederick [name] is a 55 y.o. male admitted on 03/01/2016 with septic bursitis of hip due to heroin injection.  Pharmacy has been consulted for vancomycin and Zosyn dosing.  Patient's renal function is improving.  Appears that patient did not receive vancomycin dose on 03/03/16 AM as it was not charted as given.  Plan: - Vanc 1250mg  IV Q12H - Zosyn 3.375gm IV Q8H, 4 hr infusion  - Check VT tomorrow, new goal of 15-20 mcg/mL for septic bursitis - F/U micro data to narrow abx, KCL suplementation  Height: 6\' 2"  (188 cm) Weight: 210 lb (95.255 kg) IBW/kg (Calculated) : 82.2  Temp (24hrs), Avg:98.4 F (36.9 C), Min:98.1 F (36.7 C), Max:98.7 F (37.1 C)   Recent Labs Lab 03/01/16 2017 03/02/16 0001 03/02/16 0003 03/04/16 0602  WBC 14.0*  --  14.1* 10.5  CREATININE 1.21  --  1.19 0.97  LATICACIDVEN  --  1.3  --   --     Estimated Creatinine Clearance: 100 mL/min (by C-G formula based on Cr of 0.97).    No Known Allergies  Antimicrobials this admission: Vanc 4/11 >>   Zosyn 4/11 >>  Dose adjustments this admission: N/A  Microbiology results: 4/11 BCx x2 - NGTD 4/13 right trochanter abscess - GPC on Gram stain    Pattricia Weiher D. Laney Potashang, PharmD, BCPS Pager:  [phone] 03/04/2016, 10:42 AM

## 2016-03-05 DIAGNOSIS — Z720 Tobacco use: Secondary | ICD-10-CM

## 2016-03-05 LAB — COMPREHENSIVE METABOLIC PANEL
ALK PHOS: 56 U/L (ref 38–126)
ALT: 33 U/L (ref 17–63)
AST: 45 U/L — AB (ref 15–41)
Albumin: 2 g/dL — ABNORMAL LOW (ref 3.5–5.0)
Anion gap: 8 (ref 5–15)
BUN: 14 mg/dL (ref 6–20)
CO2: 25 mmol/L (ref 22–32)
Calcium: 8.1 mg/dL — ABNORMAL LOW (ref 8.9–10.3)
Chloride: 108 mmol/L (ref 101–111)
Creatinine, Ser: 0.83 mg/dL (ref 0.61–1.24)
GFR calc non Af Amer: >60 mL/min (ref >60)
GLUCOSE: 92 mg/dL (ref 65–99)
Potassium: 3.6 mmol/L (ref 3.5–5.1)
SODIUM: 141 mmol/L (ref 135–145)
Total Bilirubin: 1.1 mg/dL (ref 0.3–1.2)
Total Protein: 5.7 g/dL — ABNORMAL LOW (ref 6.5–8.1)

## 2016-03-05 LAB — CBC WITH DIFFERENTIAL/PLATELET
BASOS PCT: 0 %
Basophils Absolute: 0 10*3/uL (ref 0.0–0.1)
Eosinophils Absolute: 0.3 10*3/uL (ref 0.0–0.7)
Eosinophils Relative: 4 %
HEMATOCRIT: 40 % (ref 39.0–52.0)
HEMOGLOBIN: 13.1 g/dL (ref 13.0–17.0)
LYMPHS ABS: 2.2 10*3/uL (ref 0.7–4.0)
Lymphocytes Relative: 32 %
MCH: 28.9 pg (ref 26.0–34.0)
MCHC: 32.8 g/dL (ref 30.0–36.0)
MCV: 88.1 fL (ref 78.0–100.0)
MONOS PCT: 9 %
Monocytes Absolute: 0.6 10*3/uL (ref 0.1–1.0)
NEUTROS ABS: 3.9 10*3/uL (ref 1.7–7.7)
NEUTROS PCT: 55 %
Platelets: 240 10*3/uL (ref 150–400)
RBC: 4.54 MIL/uL (ref 4.22–5.81)
RDW: 13.3 % (ref 11.5–15.5)
WBC: 7 10*3/uL (ref 4.0–10.5)

## 2016-03-05 LAB — MAGNESIUM: Magnesium: 2.1 mg/dL (ref 1.7–2.4)

## 2016-03-05 LAB — MRSA PCR SCREENING: MRSA BY PCR: NEGATIVE

## 2016-03-05 MED ORDER — SODIUM CHLORIDE 0.9% FLUSH
10.0000 mL | Freq: Two times a day (BID) | INTRAVENOUS | Status: DC
Start: 2016-03-05 — End: 2016-03-06
  Administered 2016-03-05 – 2016-03-06 (×2): 10 mL

## 2016-03-05 MED ORDER — SENNOSIDES-DOCUSATE SODIUM 8.6-50 MG PO TABS
2.0000 | ORAL_TABLET | Freq: Two times a day (BID) | ORAL | Status: DC
  Administered 2016-03-06: 2 via ORAL
  Filled 2016-03-05 (×2): qty 2

## 2016-03-05 MED ORDER — SODIUM CHLORIDE 0.9% FLUSH
10.0000 mL | INTRAVENOUS | Status: DC | PRN
  Administered 2016-03-05: 10 mL
  Filled 2016-03-05: qty 40

## 2016-03-05 NOTE — Evaluation (Signed)
Physical Therapy Evaluation Patient Details Name: Frederick BrandMichael A [name] MRN: [id number] DOB: 26-Jul-1961 Today's Date: 03/05/2016   History of Present Illness  55 y.o. male admitted with septic bursitis of right hip due to heroin injection. Pt s/p I&D of right hip. PMH includes drug abuse, HTN, HCV, and schizophrenia.  Clinical Impression  Pt admitted with above diagnosis. Pt currently with functional limitations due to the deficits listed below (see PT Problem List). Pt ambulated 250' with supervision and use of RW for pain control, antalgic gait pattern. Advised pt to ambulate 2-3x/ day with nursing or family.  Pt will benefit from skilled PT to increase their independence and safety with mobility to allow discharge to the venue listed below.       Follow Up Recommendations No PT follow up    Equipment Recommendations  Rolling walker with 5" wheels    Recommendations for Other Services       Precautions / Restrictions Precautions Precautions: Fall Restrictions Weight Bearing Restrictions: No      Mobility  Bed Mobility Overal bed mobility: Modified Independent Bed Mobility: Supine to Sit;Sit to Supine     Supine to sit: Modified independent (Device/Increase time) Sit to supine: Modified independent (Device/Increase time)   General bed mobility comments: pt able to get in and out of bed without assist  Transfers Overall transfer level: Needs assistance Equipment used: Rolling walker (2 wheeled) Transfers: Sit to/from Stand Sit to Stand: Supervision         General transfer comment: vc's for hand placement with RW  Ambulation/Gait Ambulation/Gait assistance: Supervision Ambulation Distance (Feet): 250 Feet Assistive device: Rolling walker (2 wheeled) Gait Pattern/deviations: Step-through pattern;Decreased stride length;Decreased weight shift to right;Antalgic Gait velocity: decreased Gait velocity interpretation: Below normal speed for age/gender General Gait  Details: shortened step length due to hip pain as well an antalgia noted. Discussed normal gait pattern and resuming as pain decreases  Stairs            Wheelchair Mobility    Modified Rankin (Stroke Patients Only)       Balance Overall balance assessment: No apparent balance deficits (not formally assessed)                                           Pertinent Vitals/Pain Pain Assessment: Faces Pain Score:  (8-9) Faces Pain Scale: Hurts even more Pain Location: right hip Pain Descriptors / Indicators: Grimacing Pain Intervention(s): Monitored during session;Premedicated before session;Limited activity within patient's tolerance    Home Living Family/patient expects to be discharged to:: Private residence Living Arrangements: Other relatives Available Help at Discharge: Family;Available 24 hours/day Type of Home: House Home Access: Stairs to enter Entrance Stairs-Rails: None Entrance Stairs-Number of Steps: 3 Home Layout: Two level;Able to live on main level with bedroom/bathroom Home Equipment: Bedside commode Additional Comments: pt lives with sister who is a Engineer, civil (consulting)nurse    Prior Function Level of Independence: Independent      ADL's / Homemaking Assistance Needed: has been managing  Comments: pt reports really wanting to change his lifestyle but has been dismissed from one rehab program, hoping to get into another     Hand Dominance        Extremity/Trunk Assessment   Upper Extremity Assessment: Overall WFL for tasks assessed           Lower Extremity Assessment: RLE deficits/detail RLE Deficits / Details: guarded  mvmt of the right hip due to pain, decreased hip flex and ext as well as decreased strength, grossly 3/5 RLE    Cervical / Trunk Assessment: Normal  Communication   Communication: No difficulties  Cognition Arousal/Alertness: Awake/alert Behavior During Therapy: WFL for tasks assessed/performed Overall Cognitive Status:  Within Functional Limits for tasks assessed                      General Comments General comments (skin integrity, edema, etc.): discussed RLE stretching once pain has decreased    Exercises        Assessment/Plan    PT Assessment Patient needs continued PT services  PT Diagnosis Abnormality of gait;Acute pain   PT Problem List Decreased strength;Decreased range of motion;Decreased activity tolerance;Decreased mobility;Pain;Decreased knowledge of use of DME  PT Treatment Interventions DME instruction;Gait training;Stair training;Functional mobility training;Therapeutic activities;Therapeutic exercise;Patient/family education   PT Goals (Current goals can be found in the Care Plan section) Acute Rehab PT Goals Patient Stated Goal: return home PT Goal Formulation: With patient Time For Goal Achievement: 03/19/16 Potential to Achieve Goals: Good    Frequency Min 3X/week   Barriers to discharge        Co-evaluation               End of Session Equipment Utilized During Treatment: Gait belt Activity Tolerance: Patient tolerated treatment well Patient left: in bed;with call bell/phone within reach Nurse Communication: Mobility status         Time: 1610-9604 PT Time Calculation (min) (ACUTE ONLY): 25 min   Charges:   PT Evaluation $PT Eval Moderate Complexity: 1 Procedure PT Treatments $Gait Training: 8-22 mins   PT G Codes:      Lyanne Co, PT  Acute Rehab Services  [phone]   Lyanne Co 03/05/2016, 3:16 PM

## 2016-03-05 NOTE — Progress Notes (Signed)
Patient's sister called to speak to the charge nurse insisting that no one had checked on the patient in several hours. RN was in the room when charge nurse arrived. Pt was angry and refused to believe the NT and RN had checked on him even though he was sleeping both times.

## 2016-03-05 NOTE — Progress Notes (Signed)
[facility] Progress Note  Patient: Frederick [name] RPZ:[id number]   PCP: Carmie Kanner, NP DOB: 1961/06/05   DOA: 03/01/2016   DOS: 03/05/2016   Date of Service: the patient was seen and examined on 03/05/2016  Subjective: Patient's pain has been well controlled. Denies having any complaint of chest pain or abdominal pain. No nausea no vomiting. Nutrition: Tolerating oral diet  Brief hospital course: Patient was admitted on 03/01/2016, with past medical history of polysubstance abuse, schizophrenia, axillary, hypertension, presented with complaint of right hip pain, the patient injected heroin in his right hip approximately 6 days ago before admission, was found to have right hip cellulitis with sepsis. Initially admitted at [facility]. After discussion with orthopedic the patient was transferred to [facility] for I&D. The patient underwent the procedure on 03/03/2016 Currently further plan is continue broad-spectrum antibiotics, await culture and sensitivity.  Assessment and Plan: 1. Cellulitis of right hip , trochanteric bursitis sepsis Initially met sepsis criteria with leukocytosis and tachycardia. Also has elevation of bilirubin and evidence of dehydration with elevated creatinine Culture from the wound is growing Staphylococcus aureus as well as gram-positive cocci in pairs. MRSA PCR is negative HIV nonreactive, blood cultures negative for 3 days. Initially started on vancomycin and Zosyn. Currently we will continue vancomycin and await sensitivity results of the culture  2. Polysubstance abuse. Pain management. The patient is complaining of significant pain on this morning but does not appear to be any distress clinically. Patient was sleeping when I arrived in the room and woke up and started complaining of pain. At present I would avoid any IV narcotics in this patient with his history of polysubstance abuse. Scheduled Toradol is ordered. I would also add  Flexeril for muscle spasm. Continue when necessary tramadol.  3. Neuropathy. Patient is on 1200 mg gabapentin 3 times daily at home. At present I would continue and monitor clinically.  4. Essential hypertension. Patient is on 3 antihypertensive medications at home. Continue amlodipine at present.  Activity: physical therapy recommended no further therapy requirement for the patient Bowel regimen: last BM 03/01/2016 Senokot and MiraLAX ordered DVT Prophylaxis: subcutaneous Heparin Nutrition: Regular diet Advance goals of care discussion: Full code  Procedures: Incision and drainage of the right trochanteric bursitis Consultants: Orthopedics Antibiotics: Anti-infectives    Start     Dose/Rate Route Frequency Ordered Stop   03/03/16 2230  ceFAZolin (ANCEF) IVPB 2g/100 mL premix     2 g 200 mL/hr over 30 Minutes Intravenous Every 6 hours 03/03/16 1952 03/04/16 1028   03/03/16 1545  piperacillin-tazobactam (ZOSYN) IVPB 3.375 g     3.375 g 12.5 mL/hr over 240 Minutes Intravenous To Surgery 03/03/16 1531 03/04/16 1545   03/02/16 1100  vancomycin (VANCOCIN) 1,250 mg in sodium chloride 0.9 % 250 mL IVPB     1,250 mg 166.7 mL/hr over 90 Minutes Intravenous Every 12 hours 03/01/16 2201     03/02/16 0700  piperacillin-tazobactam (ZOSYN) IVPB 3.375 g  Status:  Discontinued     3.375 g 12.5 mL/hr over 240 Minutes Intravenous Every 8 hours 03/01/16 2220 03/05/16 1025   03/01/16 2230  piperacillin-tazobactam (ZOSYN) IVPB 3.375 g     3.375 g 100 mL/hr over 30 Minutes Intravenous STAT 03/01/16 2219 03/02/16 0139   03/01/16 2200  vancomycin (VANCOCIN) 1,250 mg in sodium chloride 0.9 % 250 mL IVPB     1,250 mg 166.7 mL/hr over 90 Minutes Intravenous STAT 03/01/16 2155 03/02/16 0045      Family  Communication: no family was present at bedside, at the time of interview.   Disposition:  Expected discharge date: 03/06/2016 Barriers to safe discharge: Culture and physical therapy  consult   Intake/Output Summary (Last 24 hours) at 03/05/16 1859 Last data filed at 03/05/16 1651  Gross per 24 hour  Intake 91624.16 ml  Output   2560 ml  Net 89064.16 ml   Filed Weights   03/01/16 1644  Weight: 95.255 kg (210 lb)    Objective: Physical Exam: Filed Vitals:   03/04/16 2046 03/04/16 2345 03/05/16 0456 03/05/16 1350  BP: 145/81 138/77 135/92 139/80  Pulse: 72 73 71 73  Temp: 98 F (36.7 C) 97.1 F (36.2 C) 98 F (36.7 C) 98 F (36.7 C)  TempSrc: Oral Oral Oral Oral  Resp: _0 Height:      Weight:      SpO2: 100% 100% 98% 96%     General: Appear in mild distress, no Rash; Oral Mucosa moist. Cardiovascular: S1 and S2 Present, no Murmur,  Respiratory: Bilateral Air entry present and Clear to Auscultation, no Crackles, no wheezes Abdomen: Bowel Sound present, Soft and no tenderness Extremities: no Pedal edema, no calf tenderness Neurology: Grossly no focal neuro deficit.  Data Reviewed: CBC:  Recent Labs Lab 03/01/16 2017 03/02/16 0003 03/04/16 0602 03/05/16 0450  WBC 14.0* 14.1* 10.5 7.0  NEUTROABS 11.2*  --   --  3.9  HGB 13.7 14.2 13.9 13.1  HCT 41.8 42.8 42.6 40.0  MCV 89.9 88.2 87.8 88.1  PLT 188 191 276 373   Basic Metabolic Panel:  Recent Labs Lab 03/01/16 2017 03/02/16 0003 03/04/16 0602 03/05/16 0450  NA 142 140 138 141  K 3.5 3.5 3.4* 3.6  CL 107 105 102 108  CO2 _1 GLUCOSE 108* 150* 128* 92  BUN _2 CREATININE 1.21 1.19 0.97 0.83  CALCIUM 8.7* 8.5* 8.0* 8.1*  MG  --   --   --  2.1   Liver Function Tests:  Recent Labs Lab 03/02/16 0003 03/04/16 0602 03/05/16 0450  AST 44* 47* 45*  ALT 43 33 33  ALKPHOS 79 67 56  BILITOT 2.1* 1.4* 1.1  PROT 6.4* 6.2* 5.7*  ALBUMIN 3.1* 2.4* 2.0*   No results for input(s): LIPASE, AMYLASE in the last 168 hours. No results for input(s): AMMONIA in the last 168 hours.  Cardiac Enzymes: No results for input(s): CKTOTAL, CKMB, CKMBINDEX, TROPONINI in  the last 168 hours.  BNP (last 3 results) No results for input(s): BNP in the last 8760 hours.  CBG: No results for input(s): GLUCAP in the last 168 hours.  Recent Results (from the past 240 hour(s))  Culture, blood (x 2)     Status: None (Preliminary result)   Collection Time: 03/01/16 11:46 PM  Result Value Ref Range Status   Specimen Description BLOOD LEFT ANTECUBITAL  Final   Special Requests IN PEDIATRIC BOTTLE 1ML  Final   Culture   Final    NO GROWTH 2 DAYS Performed at [facility]    Report Status PENDING  Incomplete  Culture, blood (x 2)     Status: None (Preliminary result)   Collection Time: 03/01/16 11:46 PM  Result Value Ref Range Status   Specimen Description BLOOD LEFT HAND  Final   Special Requests IN PEDIATRIC BOTTLE 1ML  Final   Culture   Final    NO GROWTH 2 DAYS Performed at [facility]  Report Status PENDING  Incomplete  Anaerobic culture     Status: None (Preliminary result)   Collection Time: 03/03/16  4:47 PM  Result Value Ref Range Status   Specimen Description ABSCESS  Final   Special Requests RIGHT TROCHANTER PT ON ANCEF AND ZOSYN  Final   Gram Stain   Final    ABUNDANT WBC PRESENT, PREDOMINANTLY PMN NO SQUAMOUS EPITHELIAL CELLS SEEN FEW GRAM POSITIVE COCCI IN PAIRS Performed at [facility]    Culture   Final    NO ANAEROBES ISOLATED; CULTURE IN PROGRESS FOR 5 DAYS Performed at [facility]    Report Status PENDING  Incomplete  Culture, routine-abscess     Status: None (Preliminary result)   Collection Time: 03/03/16  4:47 PM  Result Value Ref Range Status   Specimen Description ABSCESS  Final   Special Requests RIGHT TROCHANTER PT ON ANCEF AND ZOSYN  Final   Gram Stain   Final    ABUNDANT WBC PRESENT, PREDOMINANTLY PMN NO SQUAMOUS EPITHELIAL CELLS SEEN FEW GRAM POSITIVE COCCI IN PAIRS Performed at [facility]    Culture   Final    MODERATE STAPHYLOCOCCUS AUREUS Note: RIFAMPIN AND  GENTAMICIN SHOULD NOT BE USED AS SINGLE DRUGS FOR TREATMENT OF STAPH INFECTIONS. Performed at [facility]    Report Status PENDING  Incomplete  MRSA PCR Screening     Status: None   Collection Time: 03/05/16 11:27 AM  Result Value Ref Range Status   MRSA by PCR NEGATIVE NEGATIVE Final    Comment:        The GeneXpert MRSA Assay (FDA approved for NASAL specimens only), is one component of a comprehensive MRSA colonization surveillance program. It is not intended to diagnose MRSA infection nor to guide or monitor treatment for MRSA infections.      Studies: No results found.   Scheduled Meds: . amLODipine  10 mg Oral Daily  . cyclobenzaprine  5 mg Oral TID  . enoxaparin (LOVENOX) injection  40 mg Subcutaneous QHS  . gabapentin  1,200 mg Oral TID  . ketorolac  30 mg Intravenous 4 times per day  . nicotine  21 mg Transdermal Daily  . polyethylene glycol  17 g Oral BID  . senna-docusate  1 tablet Oral BID  . sodium chloride  3,000 mL Intravenous Once  . sodium chloride flush  10-40 mL Intracatheter Q12H  . sodium chloride flush  3 mL Intravenous Q12H  . vancomycin  1,250 mg Intravenous Q12H   Continuous Infusions:   PRN Meds: acetaminophen **OR** acetaminophen, ondansetron **OR** ondansetron (ZOFRAN) IV, sodium chloride flush, traMADol  Time spent: 30 minutes  Author: Berle Mull, MD [facility] Pager: [phone] 03/05/2016 6:59 PM  If 7PM-7AM, please contact night-coverage at [email or web address], password [facility]

## 2016-03-05 NOTE — Evaluation (Addendum)
Occupational Therapy Evaluation Patient Details Name: Frederick [name] MRN: [id number] DOB: 01-13-1961 Today's Date: 03/05/2016    History of Present Illness 55 y.o. male admitted with septic bursitis of hip due to heroin injection. Pt s/p I&D of right hip. PMH includes drug abuse, HTN, HCV, and schizophrenia.   Clinical Impression   Pt s/p above. Pt independent with ADLs, PTA (was managing). Feel pt will benefit from acute OT to increase independence prior to d/c.     Follow Up Recommendations  No OT follow up;Supervision - Intermittent    Equipment Recommendations  None recommended by OT    Recommendations for Other Services       Precautions / Restrictions Precautions Precautions: Fall      Mobility Bed Mobility Overal bed mobility: Needs Assistance Bed Mobility: Supine to Sit;Sit to Supine     Supine to sit: Supervision Sit to supine: Supervision      Transfers Overall transfer level: Needs assistance   Transfers: Sit to/from Stand Sit to Stand: Min guard              Balance    Unsteady with ambulation-Min guard assist.                                        ADL Overall ADL's : Needs assistance/impaired                     Lower Body Dressing: Moderate assistance;Sit to/from stand   Toilet Transfer: Min guard;Ambulation (sit to stand from bed)           Functional mobility during ADLs: Min guard General ADL Comments: Explained AE is available for LB dressing-pt reports he will have support and not interested in working on dressing.      Vision     Perception     Praxis      Pertinent Vitals/Pain Pain Assessment: 0-10 Pain Score:  (8-9) Pain Location: right LE Pain Descriptors / Indicators: Throbbing Pain Intervention(s): Monitored during session;Repositioned     Hand Dominance     Extremity/Trunk Assessment Upper Extremity Assessment Upper Extremity Assessment: Overall WFL for tasks assessed    Lower Extremity Assessment Lower Extremity Assessment: Defer to PT evaluation (decreased strength in right LE)       Communication Communication Communication: No difficulties   Cognition Arousal/Alertness: Awake/alert Behavior During Therapy: WFL for tasks assessed/performed Overall Cognitive Status: Within Functional Limits for tasks assessed                     General Comments       Exercises       Shoulder Instructions      Home Living Family/patient expects to be discharged to:: Private residence Living Arrangements: Other relatives Available Help at Discharge: Family;Available 24 hours/day Type of Home: House Home Access: Stairs to enter [employer] of Steps: 3 Entrance Stairs-Rails:  (pt unsure) Home Layout: Two level;Able to live on main level with bedroom/bathroom     Bathroom Shower/Tub: Tub/shower unit         Home Equipment: Bedside commode          Prior Functioning/Environment Level of Independence: Independent    ADL's / Homemaking Assistance Needed: has been managing.        OT Diagnosis: Acute pain   OT Problem List: Decreased strength;Decreased range of motion;Decreased activity tolerance;Impaired balance (sitting  and/or standing);Decreased knowledge of use of DME or AE;Pain   OT Treatment/Interventions: Patient/family education;Balance training;Self-care/ADL training;DME and/or AE instruction;Therapeutic activities    OT Goals(Current goals can be found in the care plan section) Acute Rehab OT Goals OT Goal Formulation: With patient Time For Goal Achievement: 03/12/16 Potential to Achieve Goals: Good ADL Goals Pt Will Transfer to Toilet: with modified independence;ambulating;regular height toilet (with least restrictive device) Pt Will Perform Toileting - Clothing Manipulation and hygiene: sit to/from stand;with modified independence Pt Will Perform Tub/Shower Transfer: Tub transfer;ambulating;rolling walker;3 in  1;with set-up;with supervision  OT Frequency: Min 2X/week   Barriers to D/C:            Co-evaluation              End of Session Equipment Utilized During Treatment: Gait belt Nurse Communication: Mobility status;Other (comment) (no chair in room; left O2 off/O2 99%)  Activity Tolerance: Patient tolerated treatment well (became nauseous towards end so returned to supine) Patient left: in bed;with call bell/phone within reach; SCD's applied   Time: [id number] OT Time Calculation (min): 16 min Charges:  OT General Charges $OT Visit: 1 Procedure OT Evaluation $OT Eval Moderate Complexity: 1 Procedure G-CodesEarlie Raveling:    Devyn Sheerin L OTR/L [phone] 03/05/2016, 1:18 PM

## 2016-03-06 DIAGNOSIS — A4102 Sepsis due to Methicillin resistant Staphylococcus aureus: Principal | ICD-10-CM

## 2016-03-06 LAB — CULTURE, ROUTINE-ABSCESS

## 2016-03-06 MED ORDER — POLYETHYLENE GLYCOL 3350 17 G PO PACK: 17.0000 g | PACK | Freq: Two times a day (BID) | ORAL | Status: AC

## 2016-03-06 MED ORDER — METHOCARBAMOL 500 MG PO TABS: 500.0000 mg | ORAL_TABLET | Freq: Three times a day (TID) | ORAL | Status: AC | PRN

## 2016-03-06 MED ORDER — NAPROXEN 500 MG PO TABS: 500.0000 mg | ORAL_TABLET | Freq: Three times a day (TID) | ORAL | Status: AC

## 2016-03-06 MED ORDER — NICOTINE 21 MG/24HR TD PT24: 21.0000 mg | MEDICATED_PATCH | Freq: Every day | TRANSDERMAL | Status: AC

## 2016-03-06 MED ORDER — CHLORHEXIDINE GLUCONATE CLOTH 2 % EX PADS: 6.0000 | MEDICATED_PAD | Freq: Every day | CUTANEOUS | Status: DC

## 2016-03-06 MED ORDER — DOXYCYCLINE HYCLATE 50 MG PO CAPS: 50.0000 mg | ORAL_CAPSULE | Freq: Two times a day (BID) | ORAL | Status: AC

## 2016-03-06 MED ORDER — MUPIROCIN 2 % EX OINT
1.0000 "application " | TOPICAL_OINTMENT | Freq: Two times a day (BID) | CUTANEOUS | Status: DC
  Filled 2016-03-06: qty 22

## 2016-03-06 NOTE — Discharge Summary (Addendum)
[facility] Discharge Summary   Patient: Frederick [name] HKV:[id number]   PCP: Carmie Kanner, NP DOB: October 12, 1961   Date of admission: 03/01/2016   Date of discharge:  03/06/2016    Discharge Diagnoses:  Principal Problem:   Cellulitis of right hip Active Problems:   Drug abuse   Hypertension   Tobacco abuse   Sepsis ([place])   Cellulitis  Recommendations for Outpatient Follow-up:  1. Follow-up with PCP in one week. 2. Call orthopedic surgery in 1 week regarding follow-up.   Follow-up Information    Follow up with [facility], NP. Schedule an appointment as soon as possible for a visit in 1 week.   Contact information:   [address] [place] [address] [phone]       Call Renette Butters, MD.   Specialty:  Orthopedic Surgery   Why:  As needed   Contact information:   [address] [address] [phone]      Diet recommendation: regular diet  Activity: The patient is advised to gradually reintroduce usual activities.  Discharge Condition: good  History of present illness: As per the H and P dictated on admission, "Frederick MENDELL is a 55 y.o. male with PMH of polysubstance abuse, including tobacco abuse, hearing abuse, cocaine abuse, schizophrenia, HCV, hypertension, who presents with lateral hip pain.  Pt reports that he injected heroin into the soft tissue of his right lateral hip area approximate 6 days ago. He developedsevere pain and erythema over this area. He does not have fever or chills. He has nausea, no vomiting or diarrhea. Patient denies chest pain, shortness of breath, cough, symptoms of UTI or unilateral weakness.  In ED, patient was found to have WBC 14.0, temperature normal, heart rate 92, electrolytes and renal function okay. X-ray of her right hip is negative for acute abnormalities. Patient is admitted to inpatient for further eval and treatment"  Hospital Course:  Patient was admitted on  03/01/2016, with past medical history of polysubstance abuse, schizophrenia, axillary, hypertension, presented with complaint of right hip pain, the patient injected heroin in his right hip approximately 6 days ago before admission, was found to have right hip cellulitis with sepsis. Initially admitted at [facility]. After discussion with orthopedic the patient was transferred to [facility] for I&D. The patient underwent the procedure on 03/03/2016. Patient's culture grew MRSA. Discussed with infectious disease recommended to treat with oral doxycycline for 2 weeks from the surgery.  Summary of his active problems in the hospital is as following. 1. Cellulitis of right hip , trochanteric bursitis sepsis due to MRSA Initially met sepsis criteria with leukocytosis and tachycardia. Also has elevation of bilirubin and evidence of dehydration with elevated creatinine Culture from the wound is growing MRSA MRSA PCR is negative HIV nonreactive, blood cultures negative for 3 days. Initially started on vancomycin and Zosyn.  Patient will be discharged on 2 weeks of doxycycline from the surgery.  2. Polysubstance abuse. Tobacco abuse Pain management. The patient is complaining of significant pain but does not appear to be any distress clinically. Patient is sleeping when I arrive in the room and on waking up starts complaining of 10 out of 10 pain. At present I would avoid any IV narcotics in this patient with his history of polysubstance abuse. Smoking cessation counseling was done, nicotine patch on discharge. Patient will be discharged on oral Robaxin and naproxen.  3. Neuropathy. Patient is on 1200 mg gabapentin 3  times daily at home. At present I would continue and monitor clinically.  4. Essential hypertension. Patient is on 3 antihypertensive medications at home. As his blood pressure is well controlled only on amlodipine here in the hospital, on discharge Continue amlodipine  at present.  All other chronic medical condition were stable during the hospitalization.  Patient was seen by physical therapy, who recommended no requirement for therapy as an outpatient. On the day of the discharge the patient's vitals were stable, and no other acute medical condition were reported by patient. the patient was felt safe to be discharge at home with family.  Procedures and Results:  Incision and drainage of the right trochanteric bursitis  03/03/2016  Consultations:  Orthopedics  DISCHARGE MEDICATION: Current Discharge Medication List    START taking these medications   Details  doxycycline (VIBRAMYCIN) 50 MG capsule Take 1 capsule (50 mg total) by mouth 2 (two) times daily. Qty: 24 capsule, Refills: 0    methocarbamol (ROBAXIN) 500 MG tablet Take 1 tablet (500 mg total) by mouth every 8 (eight) hours as needed for muscle spasms. Qty: 15 tablet, Refills: 0    naproxen (NAPROSYN) 500 MG tablet Take 1 tablet (500 mg total) by mouth 3 (three) times daily with meals. Qty: 30 tablet, Refills: 0    nicotine (NICODERM CQ - DOSED IN MG/24 HOURS) 21 mg/24hr patch Place 1 patch (21 mg total) onto the skin daily. Qty: 28 patch, Refills: 0    polyethylene glycol (MIRALAX / GLYCOLAX) packet Take 17 g by mouth 2 (two) times daily. Qty: 14 each, Refills: 0      CONTINUE these medications which have NOT CHANGED   Details  amLODipine (NORVASC) 10 MG tablet Take 1 tablet (10 mg total) by mouth daily. Qty: 30 tablet, Refills: 0    gabapentin (NEURONTIN) 300 MG capsule Take 1,200 mg by mouth 3 (three) times daily.      STOP taking these medications     enalapril (VASOTEC) 20 MG tablet      hydrochlorothiazide (HYDRODIURIL) 25 MG tablet        No Known Allergies Discharge Instructions    Diet general    Complete by:  As directed      Discharge instructions    Complete by:  As directed   It is important that you read following instructions as well as go over your  medication list with RN to help you understand your care after this hospitalization.  Discharge Instructions: Please follow-up with PCP in one week  Please request your primary care physician to go over all Hospital Tests and Procedure/Radiological results at the follow up,  Please get all Hospital records sent to your PCP by signing hospital release before you go home.   Do not drive, operating heavy machinery, perform activities at heights, swimming or participation in water activities or provide baby sitting services while your are on Pain, Sleep and Anxiety Medications; until you have been seen by Primary Care Physician or a Neurologist and advised to do so again. Do not take more than prescribed Pain, Sleep and Anxiety Medications. You were cared for by a hospitalist during your hospital stay. If you have any questions about your discharge medications or the care you received while you were in the hospital after you are discharged, you can call the unit and ask to speak with the hospitalist on call if the hospitalist that took care of you is not available.  Once you are discharged, your primary care  physician will handle any further medical issues. Please note that NO REFILLS for any discharge medications will be authorized once you are discharged, as it is imperative that you return to your primary care physician (or establish a relationship with a primary care physician if you do not have one) for your aftercare needs so that they can reassess your need for medications and monitor your lab values. You Must read complete instructions/literature along with all the possible adverse reactions/side effects for all the Medicines you take and that have been prescribed to you. Take any new Medicines after you have completely understood and accept all the possible adverse reactions/side effects. Wear Seat belts while driving. If you have smoked or chewed Tobacco in the last 2 yrs please stop smoking  and/or stop any Recreational drug use.     Discharge wound care:    Complete by:  As directed   Dry dressing, change daily.     Increase activity slowly    Complete by:  As directed           Discharge Exam: Filed Weights   03/01/16 1644  Weight: 95.255 kg (210 lb)   Filed Vitals:   03/05/16 2200 03/06/16 0444  BP: 142/90 145/93  Pulse: 75 77  Temp: 98.4 F (36.9 C) 98.6 F (37 C)  Resp: 18 18   General: Appear in mild distress, no Rash; Oral Mucosa moist. Cardiovascular: S1 and S2 Present, no Murmur, no JVD Respiratory: Bilateral Air entry present and Clear to Auscultation, no Crackles, no wheezes Abdomen: Bowel Sound present, Soft and no tenderness Extremities: no Pedal edema, no calf tenderness Neurology: Grossly no focal neuro deficit.  The results of significant diagnostics from this hospitalization (including imaging, microbiology, ancillary and laboratory) are listed below for reference.    Significant Diagnostic Studies: Ct Hip Right W Contrast  03/02/2016  CLINICAL DATA:  Right lateral hip pain after recent drug injection approximately 6 days ago. Leukocytosis. EXAM: CT OF THE RIGHT HIP WITH CONTRAST TECHNIQUE: Multidetector CT imaging was performed following the standard protocol during bolus administration of intravenous contrast. CONTRAST:  173m ISOVUE-300 IOPAMIDOL (ISOVUE-300) INJECTION 61% COMPARISON:  Radiographs 03/01/2016. FINDINGS: No evidence of acute fracture or dislocation. There is no evidence of femoral head avascular necrosis. The right hip joint space is maintained. The visualized right sacroiliac joint demonstrates no acute findings, although may be partially ankylosed. Motion artifact accounts for irregularity of the mid sacrum on the reformatted views. Lateral to the right greater trochanter, there is an ill-defined fluid collection which demonstrates low level peripheral enhancement. This measures approximately 13.5 x 2.6 x 4.4 cm. No foreign body or  soft tissue emphysema is identified in this area. The overlying subcutaneous fat demonstrates soft tissue stranding without other focal fluid collection or soft tissue emphysema. No intramuscular fluid collections are seen. There is no significant right hip joint effusion. No intrapelvic inflammatory changes or fluid collections are seen. Bladder wall thickening is probably related to limited bladder distention. IMPRESSION: 1. Peritrochanteric fluid collection on the right consistent with trochanteric bursitis. Given the patient's history, this is potentially infected. Fluid sampling/drainage should be considered. 2. Overlying soft tissue stranding in the subcutaneous fat lateral to the right hip consistent with cellulitis. 3. No evidence of deep abscess, septic joint or osteomyelitis. Electronically Signed   By: WRichardean SaleM.D.   On: 03/02/2016 18:48   Dg Chest Port 1 View  03/03/2016  CLINICAL DATA:  Encounter for RIGHT central line placement EXAM: PORTABLE CHEST  1 VIEW COMPARISON:  Portable exam 1808 hours without priors for comparison. FINDINGS: RIGHT jugular central venous catheter with tip projecting over SVC. Upper normal heart size. Mediastinal contours and pulmonary vascularity normal. Lungs clear. No pleural effusion or pneumothorax. Bones unremarkable. IMPRESSION: No pneumothorax following RIGHT jugular line placement. Electronically Signed   By: Lavonia Dana M.D.   On: 03/03/2016 18:30   Dg Hip Unilat With Pelvis 2-3 Views Right  03/01/2016  CLINICAL DATA:  Right hip pain for 2 days, no known injury, initial encounter EXAM: DG HIP (WITH OR WITHOUT PELVIS) 2-3V RIGHT COMPARISON:  None. FINDINGS: Pelvic ring is intact. No acute fracture or dislocation is noted. No gross soft tissue abnormality is seen. IMPRESSION: No acute abnormality noted. Electronically Signed   By: Inez Catalina M.D.   On: 03/01/2016 20:19    Microbiology: Recent Results (from the past 240 hour(s))  Culture, blood (x 2)      Status: None (Preliminary result)   Collection Time: 03/01/16 11:46 PM  Result Value Ref Range Status   Specimen Description BLOOD LEFT ANTECUBITAL  Final   Special Requests IN PEDIATRIC BOTTLE 1ML  Final   Culture   Final    NO GROWTH 3 DAYS Performed at [facility]    Report Status PENDING  Incomplete  Culture, blood (x 2)     Status: None (Preliminary result)   Collection Time: 03/01/16 11:46 PM  Result Value Ref Range Status   Specimen Description BLOOD LEFT HAND  Final   Special Requests IN PEDIATRIC BOTTLE 1ML  Final   Culture   Final    NO GROWTH 3 DAYS Performed at [facility]    Report Status PENDING  Incomplete  Anaerobic culture     Status: None (Preliminary result)   Collection Time: 03/03/16  4:47 PM  Result Value Ref Range Status   Specimen Description ABSCESS  Final   Special Requests RIGHT TROCHANTER PT ON ANCEF AND ZOSYN  Final   Gram Stain   Final    ABUNDANT WBC PRESENT, PREDOMINANTLY PMN NO SQUAMOUS EPITHELIAL CELLS SEEN FEW GRAM POSITIVE COCCI IN PAIRS Performed at [facility]    Culture   Final    NO ANAEROBES ISOLATED; CULTURE IN PROGRESS FOR 5 DAYS Performed at [facility]    Report Status PENDING  Incomplete  Culture, routine-abscess     Status: None   Collection Time: 03/03/16  4:47 PM  Result Value Ref Range Status   Specimen Description ABSCESS  Final   Special Requests RIGHT TROCHANTER PT ON ANCEF AND ZOSYN  Final   Gram Stain   Final    ABUNDANT WBC PRESENT, PREDOMINANTLY PMN NO SQUAMOUS EPITHELIAL CELLS SEEN FEW GRAM POSITIVE COCCI IN PAIRS Performed at [facility]    Culture   Final    MODERATE METHICILLIN RESISTANT STAPHYLOCOCCUS AUREUS Note: RIFAMPIN AND GENTAMICIN SHOULD NOT BE USED AS SINGLE DRUGS FOR TREATMENT OF STAPH INFECTIONS. CRITICAL RESULT CALLED TO, READ BACK BY AND VERIFIED WITH: VICTORIA RN 4.16.17 1215 BY PERCN Performed at [facility]    Report Status 03/06/2016  FINAL  Final   Organism ID, Bacteria METHICILLIN RESISTANT STAPHYLOCOCCUS AUREUS  Final      Susceptibility   Methicillin resistant staphylococcus aureus - MIC*    CLINDAMYCIN >=8 RESISTANT Resistant     ERYTHROMYCIN >=8 RESISTANT Resistant     GENTAMICIN <=0.5 SENSITIVE Sensitive     LEVOFLOXACIN 0.25 SENSITIVE Sensitive     OXACILLIN >=  4 RESISTANT Resistant     RIFAMPIN <=0.5 SENSITIVE Sensitive     TRIMETH/SULFA <=10 SENSITIVE Sensitive     VANCOMYCIN 1 SENSITIVE Sensitive     TETRACYCLINE <=1 SENSITIVE Sensitive     * MODERATE METHICILLIN RESISTANT STAPHYLOCOCCUS AUREUS  MRSA PCR Screening     Status: None   Collection Time: 03/05/16 11:27 AM  Result Value Ref Range Status   MRSA by PCR NEGATIVE NEGATIVE Final    Comment:        The GeneXpert MRSA Assay (FDA approved for NASAL specimens only), is one component of a comprehensive MRSA colonization surveillance program. It is not intended to diagnose MRSA infection nor to guide or monitor treatment for MRSA infections.      Labs: CBC:  Recent Labs Lab 03/01/16 2017 03/02/16 0003 03/04/16 0602 03/05/16 0450  WBC 14.0* 14.1* 10.5 7.0  NEUTROABS 11.2*  --   --  3.9  HGB 13.7 14.2 13.9 13.1  HCT 41.8 42.8 42.6 40.0  MCV 89.9 88.2 87.8 88.1  PLT 188 191 276 387   Basic Metabolic Panel:  Recent Labs Lab 03/01/16 2017 03/02/16 0003 03/04/16 0602 03/05/16 0450  NA 142 140 138 141  K 3.5 3.5 3.4* 3.6  CL 107 105 102 108  CO2 '28 26 25 25  ' GLUCOSE 108* 150* 128* 92  BUN '16 17 12 14  ' CREATININE 1.21 1.19 0.97 0.83  CALCIUM 8.7* 8.5* 8.0* 8.1*  MG  --   --   --  2.1   Liver Function Tests:  Recent Labs Lab 03/02/16 0003 03/04/16 0602 03/05/16 0450  AST 44* 47* 45*  ALT 43 33 33  ALKPHOS 79 67 56  BILITOT 2.1* 1.4* 1.1  PROT 6.4* 6.2* 5.7*  ALBUMIN 3.1* 2.4* 2.0*   Time spent: 30 minutes  Signed:  Hanzel Pizzo  [facility]  03/06/2016  , 3:57 PM

## 2016-03-06 NOTE — Progress Notes (Signed)
Subjective: 3 Days Post-Op Procedure(s) (LRB): IRRIGATION AND DEBRIDEMENT RIGHT HIP BURSA (Right) Patient reports pain as 3 on 0-10 scale.    Objective: Vital signs in last 24 hours: Temp:  [98 F (36.7 C)-98.6 F (37 C)] 98.6 F (37 C) (04/16 0444) Pulse Rate:  [73-77] 77 (04/16 0444) Resp:  [18-19] 18 (04/16 0444) BP: (139-145)/(80-93) 145/93 mmHg (04/16 0444) SpO2:  [95 %-98 %] 98 % (04/16 0444)  Intake/Output from previous day: 04/15 0701 - 04/16 0700 In: 16109.691264.2 [I.V.:8330.8; IV Piggyback:82933.3] Out: 1550 [Urine:1450; Drains:100] Intake/Output this shift: Total I/O In: 120 [P.O.:120] Out: -    Recent Labs  03/04/16 0602 03/05/16 0450  HGB 13.9 13.1    Recent Labs  03/04/16 0602 03/05/16 0450  WBC 10.5 7.0  RBC 4.85 4.54  HCT 42.6 40.0  PLT 276 240    Recent Labs  03/04/16 0602 03/05/16 0450  NA 138 141  K 3.4* 3.6  CL 102 108  CO2 25 25  BUN 12 14  CREATININE 0.97 0.83  GLUCOSE 128* 92  CALCIUM 8.0* 8.1*   No results for input(s): LABPT, INR in the last 72 hours.  ABD soft Neurovascular intact Sensation intact distally Intact pulses distally Dorsiflexion/Plantar flexion intact Incision: moderate drainage  Assessment/Plan: 3 Days Post-Op Procedure(s) (LRB): IRRIGATION AND DEBRIDEMENT RIGHT HIP BURSA (Right)  Principal Problem:   Cellulitis of right hip Active Problems:   Drug abuse   Hypertension   Tobacco abuse   Sepsis ([facility])   Cellulitis  Up with therapy  Drain pulled today.  Wound wiped with 3 CHG pads.  New sterile dressing applied.  Continue management per medicine.  Pascal LuxSHEPPERSON,Kenniel Bergsma J 03/06/2016, 12:36 PM

## 2016-03-06 NOTE — Progress Notes (Addendum)
Occupational Therapy Treatment and Discharge Patient Details Name: Frederick [name] MRN: [id number] DOB: [birth year] Today's Date: 03/06/2016    History of present illness 55 y.o. male admitted with septic bursitis of right hip due to heroin injection. Pt s/p I&D of right hip. PMH includes drug abuse, HTN, HCV, and schizophrenia.   OT comments  Pt able to meet all OT goals adequately for d/c from OT services this session. Pt able to perform toilet and tub transfers with supervision for safety. Educated pt on use of 3 in 1 in tub as a seat; pt verbalized understanding. Pt with no further questions or concerns for OT at this time. No further acute OT needs identified; signing off at this time. Please re-consult if needs change.   Follow Up Recommendations  No OT follow up;Supervision - Intermittent    Equipment Recommendations  None recommended by OT    Recommendations for Other Services      Precautions / Restrictions Precautions Precautions: Fall Restrictions Weight Bearing Restrictions: No       Mobility Bed Mobility Overal bed mobility: Modified Independent                Transfers Overall transfer level: Needs assistance Equipment used: None Transfers: Sit to/from Stand Sit to Stand: Supervision         General transfer comment: Supervision for safety; no physical or external assist required.    Balance Overall balance assessment: No apparent balance deficits (not formally assessed)                                 ADL Overall ADL's : Needs assistance/impaired                         Toilet Transfer: Supervision/safety;Ambulation;Regular Toilet   Toileting- Water quality scientist and Hygiene: Supervision/safety;Sit to/from stand   Tub/ Shower Transfer: Supervision/safety;Ambulation;3 in 1;Tub transfer Tub/Shower Transfer Details (indicate cue type and reason): Educated on use of 3 in 1 for tub transfer; pt able to return demo with  supervision for safety. Functional mobility during ADLs: Supervision/safety General ADL Comments: Pt reports he feels comfortable with managing ADLs and mobility upon return home with assist/supervision of family. Discussed d/c from OT services; pt is agreeable.       Vision                     Perception     Praxis      Cognition   Behavior During Therapy: WFL for tasks assessed/performed Overall Cognitive Status: Within Functional Limits for tasks assessed                       Extremity/Trunk Assessment               Exercises     Shoulder Instructions       General Comments      Pertinent Vitals/ Pain       Pain Assessment: Faces Faces Pain Scale: Hurts even more Pain Location: R hip Pain Descriptors / Indicators: Grimacing;Sore Pain Intervention(s): Monitored during session;Repositioned  Home Living                                          Prior Functioning/Environment  Frequency Min 2X/week     Progress Toward Goals  OT Goals(current goals can now be found in the care plan section)  Progress towards OT goals: Goals met/education completed, patient discharged from OT  Acute Rehab OT Goals Patient Stated Goal: return home OT Goal Formulation: All assessment and education complete, DC therapy  Plan Discharge plan remains appropriate;All goals met and education completed, patient discharged from OT services    Co-evaluation                 End of Session     Activity Tolerance Patient tolerated treatment well   Patient Left in bed;with call bell/phone within reach   Nurse Communication          Time: 6256-3893 OT Time Calculation (min): 20 min  Charges: OT General Charges $OT Visit: 1 Procedure OT Treatments $Self Care/Home Management : 8-22 mins  Binnie Kand M.S., OTR/L Pager: [phone]  03/06/2016, 11:01 AM

## 2016-03-06 NOTE — Progress Notes (Signed)
Pharmacy Antibiotic Note  Frederick [name] is a 55 y.o. male admitted on 03/01/2016 with septic bursitis.  Pharmacy has been consulted for Vancomycin dosing.  Plan: -Re-schedule vancomycin trough for today at 1030 due to delay in collecting tonight   [name], [name] 03/06/2016 1:06 AM

## 2016-03-07 LAB — CULTURE, BLOOD (ROUTINE X 2)
CULTURE: NO GROWTH
Culture: NO GROWTH

## 2016-03-08 ENCOUNTER — Encounter (HOSPITAL_COMMUNITY): Payer: Self-pay | Admitting: Orthopedic Surgery

## 2016-03-08 LAB — ANAEROBIC CULTURE

## 2017-03-15 ENCOUNTER — Ambulatory Visit (INDEPENDENT_AMBULATORY_CARE_PROVIDER_SITE_OTHER): Payer: PRIVATE HEALTH INSURANCE | Admitting: Podiatry

## 2017-03-15 DIAGNOSIS — M79676 Pain in unspecified toe(s): Secondary | ICD-10-CM

## 2017-03-15 DIAGNOSIS — E1142 Type 2 diabetes mellitus with diabetic polyneuropathy: Secondary | ICD-10-CM | POA: Diagnosis not present

## 2017-03-15 DIAGNOSIS — B351 Tinea unguium: Secondary | ICD-10-CM

## 2017-03-15 NOTE — Progress Notes (Signed)
Complaint:  Visit Type: Patient returns to my office for continued preventative foot care services. Complaint: Patient states" my nails have grown long and thick and become painful to walk and wear shoes" Patient has been diagnosed with DM with no foot complications. The patient presents for preventative foot care services. No changes to ROS  Podiatric Exam: Vascular: dorsalis pedis and posterior tibial pulses are palpable bilateral. Capillary return is immediate. Temperature gradient is WNL. Skin turgor WNL  Sensorium: Normal Semmes Weinstein monofilament test. Normal tactile sensation bilaterally. Nail Exam: Pt has thick disfigured discolored nails with subungual debris noted bilateral entire nail hallux through fifth toenails Ulcer Exam: There is no evidence of ulcer or pre-ulcerative changes or infection. Orthopedic Exam: Muscle tone and strength are WNL. No limitations in general ROM. No crepitus or effusions noted. Foot type and digits show no abnormalities. Bony prominences are unremarkable. Skin: No Porokeratosis. No infection or ulcers  Diagnosis:  Onychomycosis, , Pain in right toe, pain in left toes  Treatment & Plan Procedures and Treatment: Consent by patient was obtained for treatment procedures. The patient understood the discussion of treatment and procedures well. All questions were answered thoroughly reviewed. Debridement of mycotic and hypertrophic toenails, 1 through 5 bilateral and clearing of subungual debris. No ulceration, no infection noted.  Return Visit-Office Procedure: Patient instructed to return to the office for a follow up visit 3 months for continued evaluation and treatment.    Sebastien Jackson DPM 

## 2018-03-19 IMAGING — CT CT HIP*R* W/CM
2 of 3 series · 17 of 46 positions shown, 19 images · IV contrast (iopamidol)
Comparison: Radiographs 03/01/2016.

CLINICAL DATA: Right lateral hip pain after recent drug injection
approximately 6 days ago. Leukocytosis.

EXAM:
CT OF THE RIGHT HIP WITH CONTRAST
TECHNIQUE: Multidetector CT imaging was performed following the standard
protocol during bolus administration of intravenous contrast.
CONTRAST:  100mL FBEHSH-411 IOPAMIDOL (FBEHSH-411) INJECTION 61%

[Series 3: hip st · axial · 0.56mm/px · z∈[-599,-380]mm · 14 of 85 slices shown, 16 images]
[im 6/85  soft-tissue]
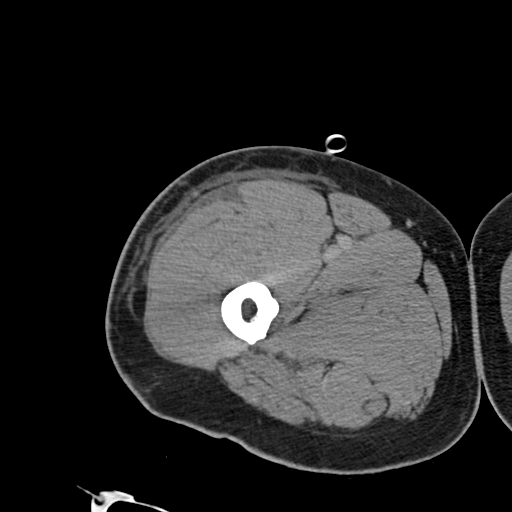
[im 6/85  bone]
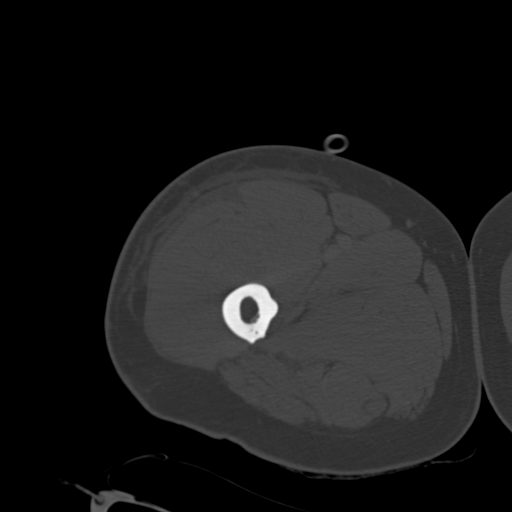
[im 11/85  soft-tissue]
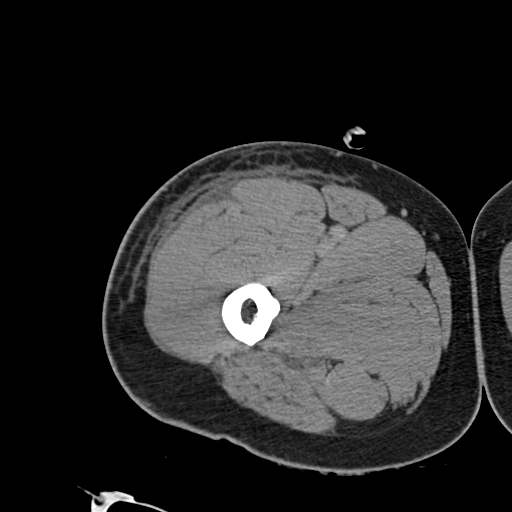
[im 17/85  soft-tissue]
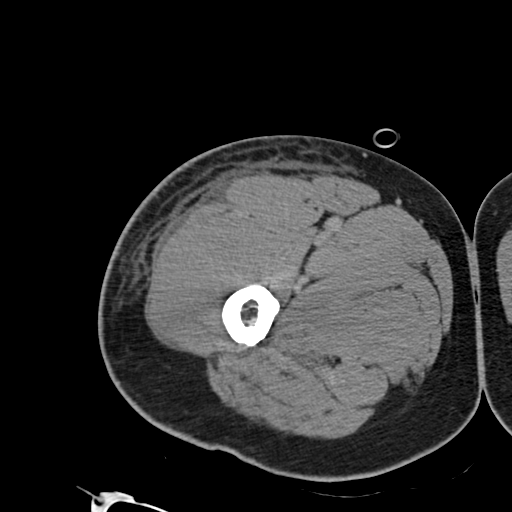
[im 22/85  soft-tissue]
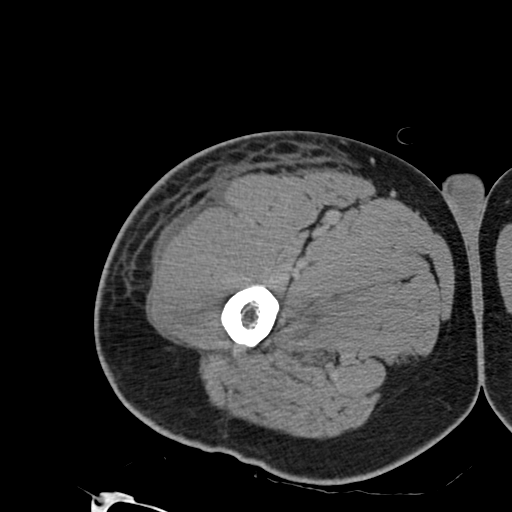
[im 28/85  soft-tissue]
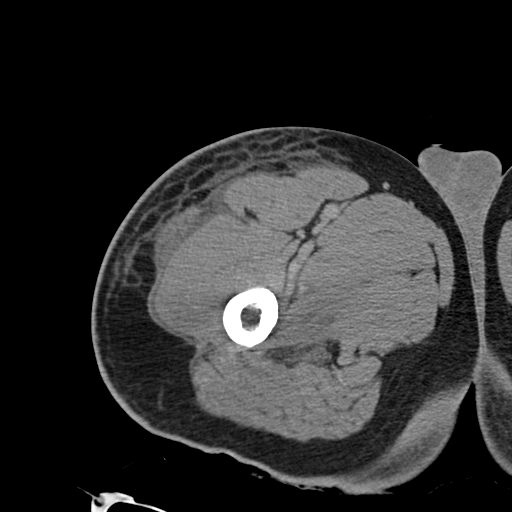
[im 33/85  soft-tissue]
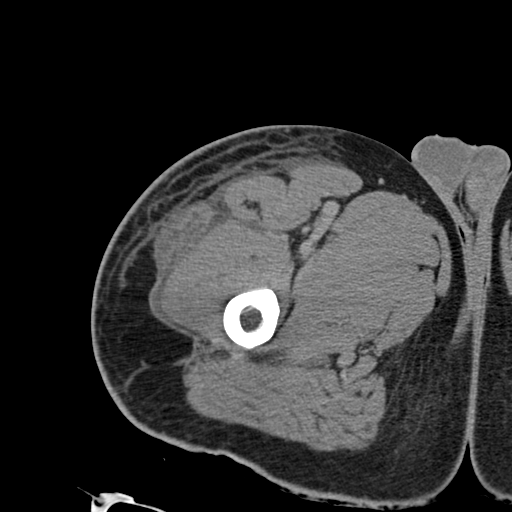
[im 38/85  soft-tissue]
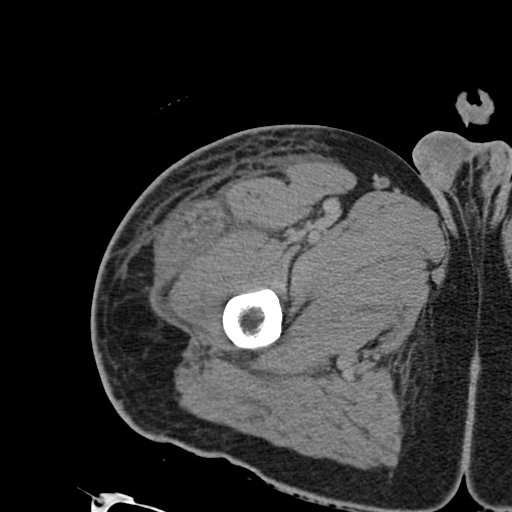
[im 47/85  soft-tissue]
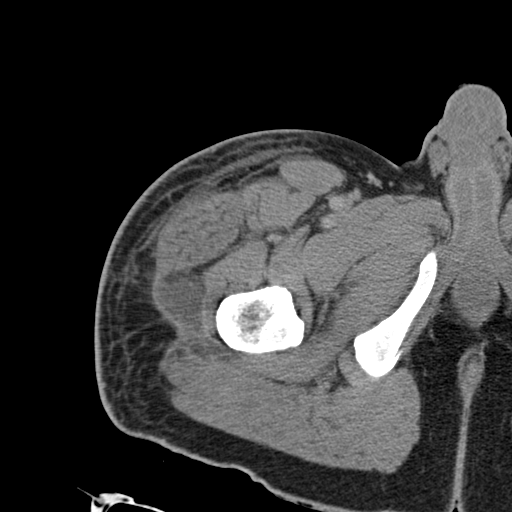
[im 52/85  soft-tissue]
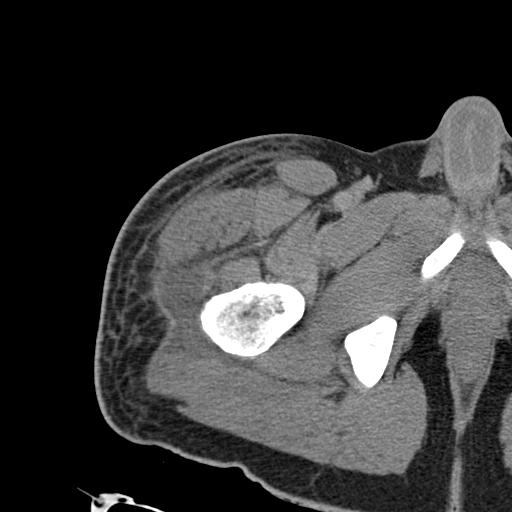
[im 52/85  bone]
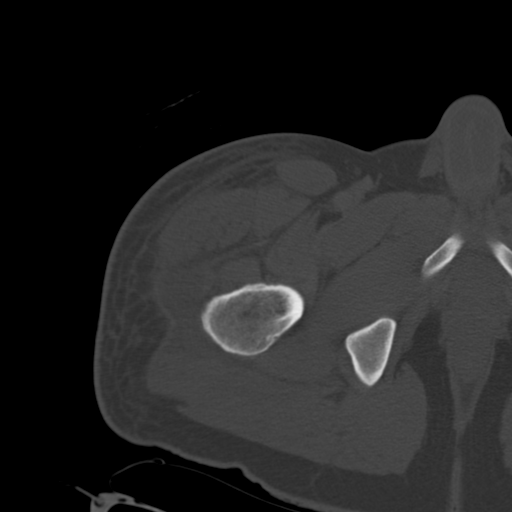
[im 57/85  soft-tissue]
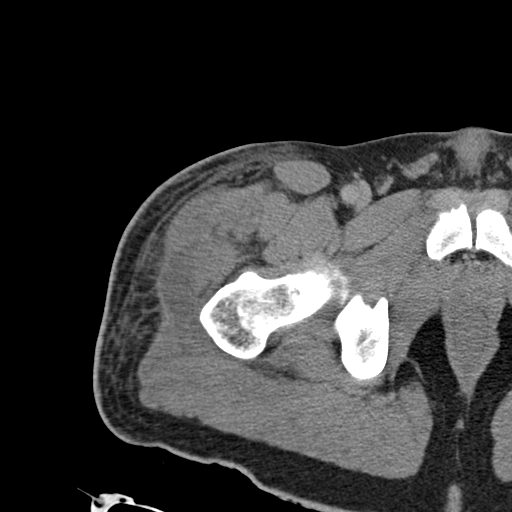
[im 63/85  soft-tissue]
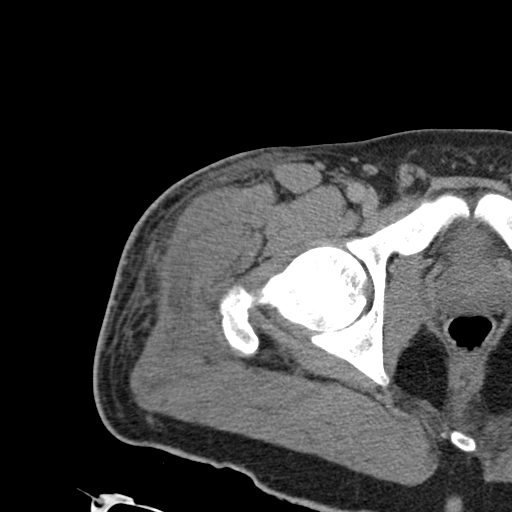
[im 68/85  soft-tissue]
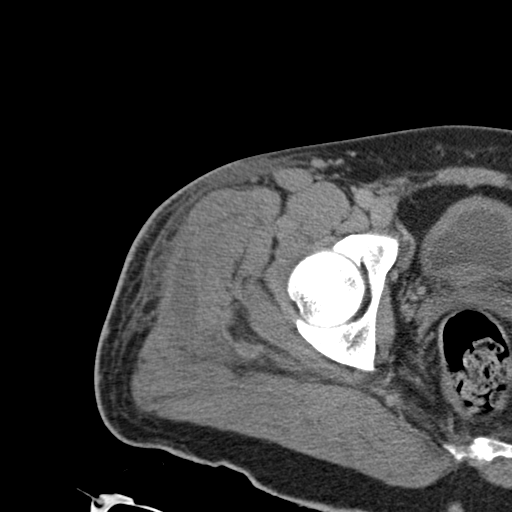
[im 74/85  soft-tissue]
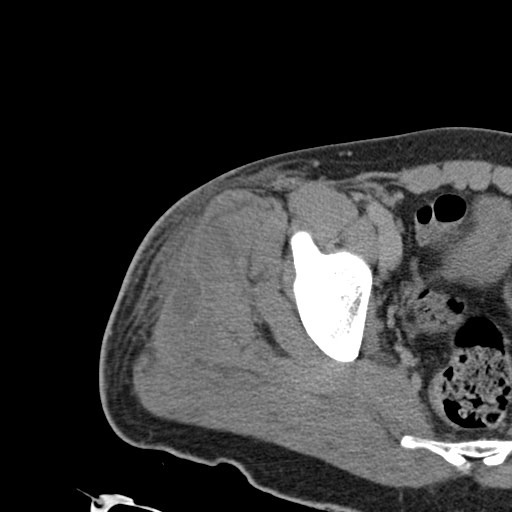
[im 79/85  soft-tissue]
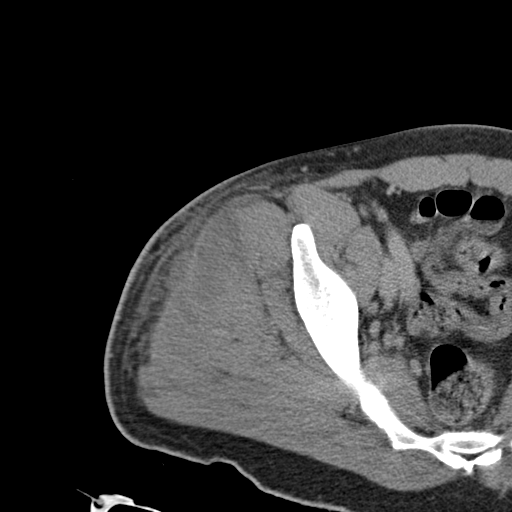

[Series 604: coronal st · coronal · 0.56mm/px · 3 of 106 slices shown]
[im 36/106  soft-tissue]
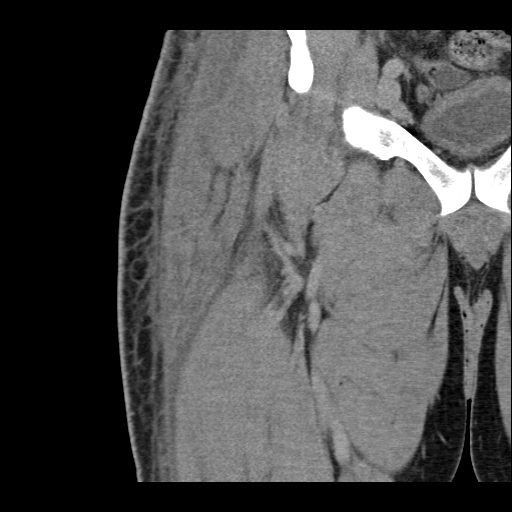
[im 47/106  soft-tissue]
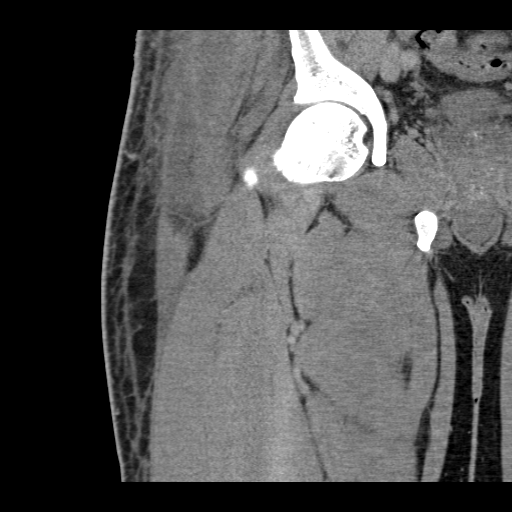
[im 59/106  soft-tissue]
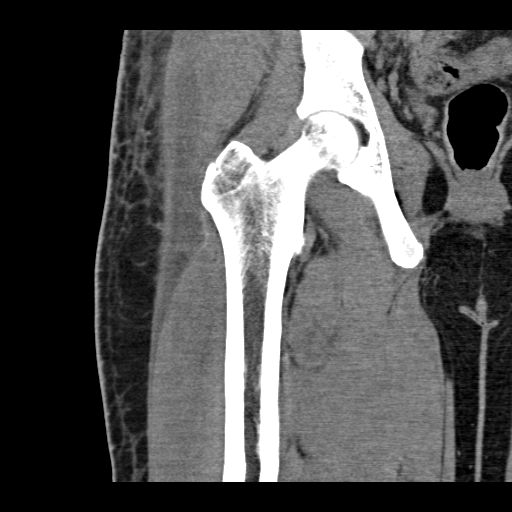

[17 of 46 positions shown; findings below may reference images not displayed]

FINDINGS: No evidence of acute fracture or dislocation. There is no evidence
of femoral head avascular necrosis. The right hip joint space is
maintained. The visualized right sacroiliac joint demonstrates no
acute findings, although may be partially ankylosed. Motion artifact
accounts for irregularity of the mid sacrum on the reformatted
views.

Lateral to the right greater trochanter, there is an ill-defined
fluid collection which demonstrates low level peripheral
enhancement. This measures approximately 13.5 x 2.6 x 4.4 cm. No
foreign body or soft tissue emphysema is identified in this area.
The overlying subcutaneous fat demonstrates soft tissue stranding
without other focal fluid collection or soft tissue emphysema. No
intramuscular fluid collections are seen. There is no significant
right hip joint effusion. No intrapelvic inflammatory changes or
fluid collections are seen. Bladder wall thickening is probably
related to limited bladder distention.
IMPRESSION: 1. Peritrochanteric fluid collection on the right consistent with
trochanteric bursitis. Given the patient's history, this is
potentially infected. Fluid sampling/drainage should be considered.
2. Overlying soft tissue stranding in the subcutaneous fat lateral
to the right hip consistent with cellulitis.
3. No evidence of deep abscess, septic joint or osteomyelitis.

## 2018-03-20 IMAGING — CR DG CHEST 1V PORT
1 series · 1 of 1 positions shown · non-contrast
Comparison: Portable exam 5353 hours without priors for comparison.

CLINICAL DATA: Encounter for RIGHT central line placement

EXAM:
PORTABLE CHEST 1 VIEW

[AP]
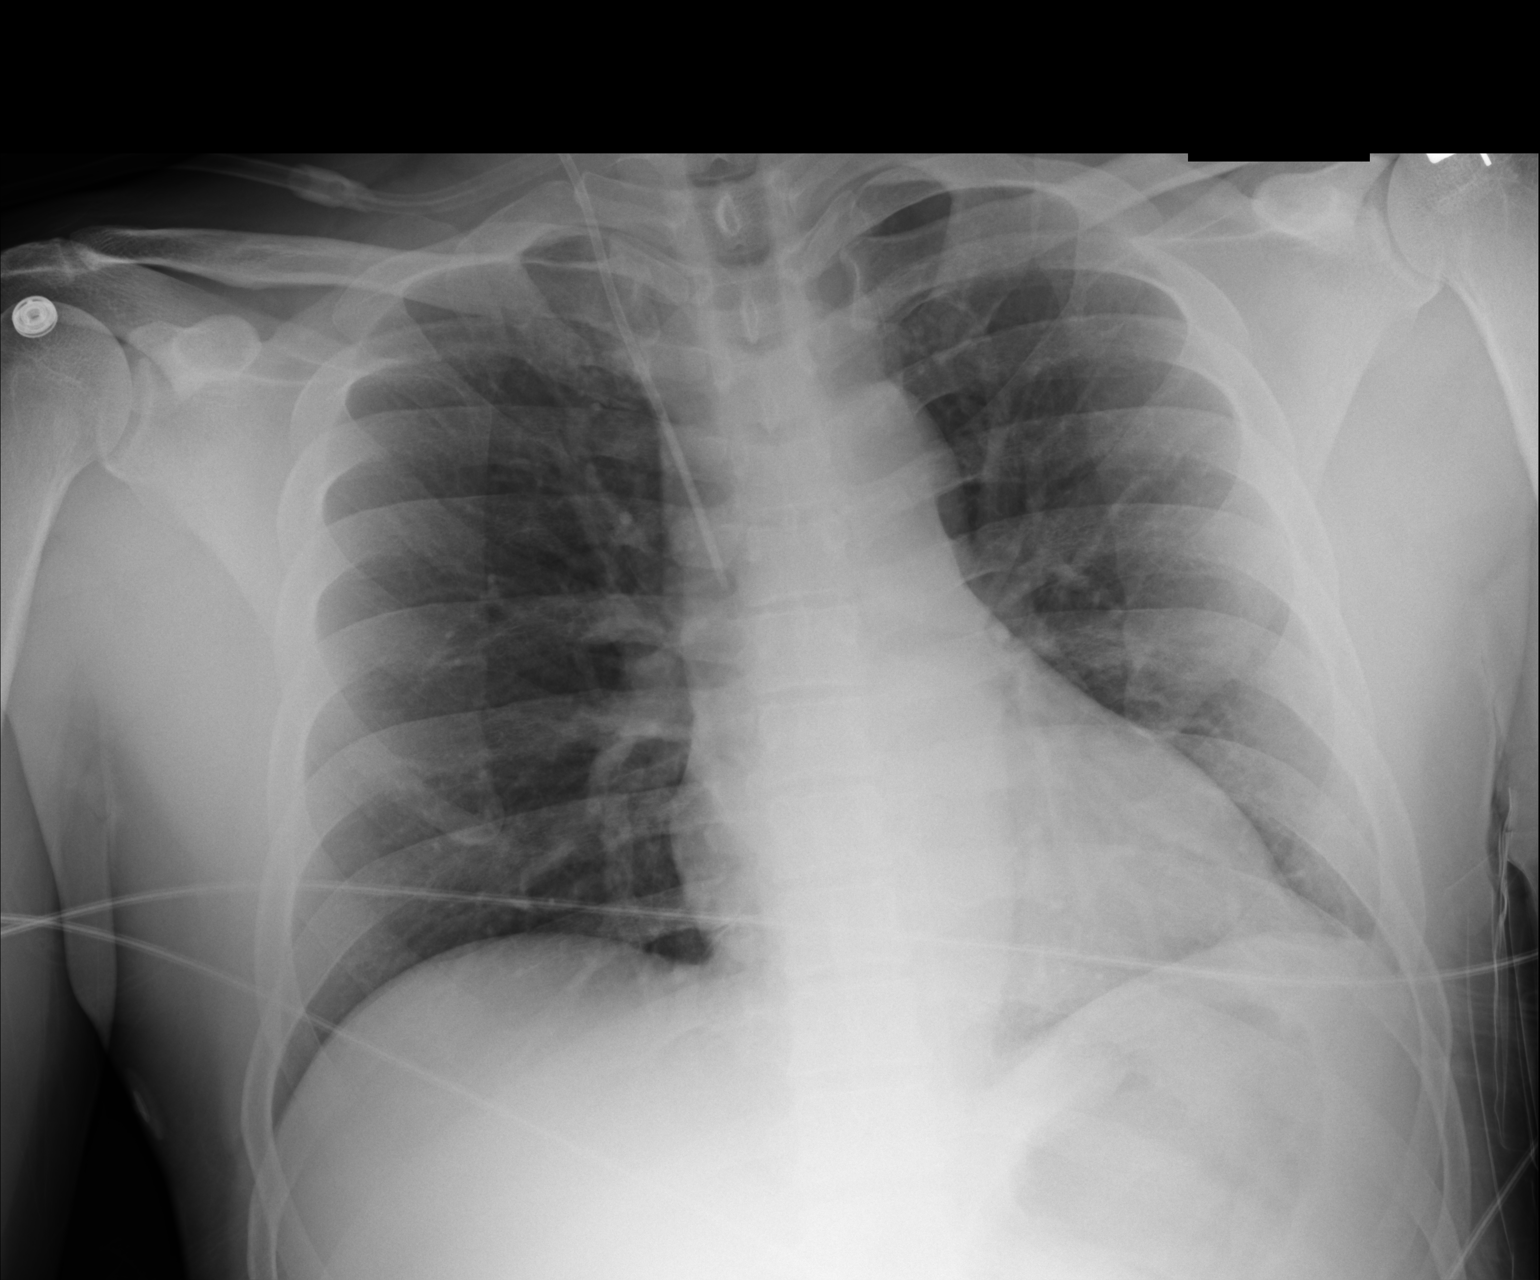

[1 of 1 positions shown; findings below may reference images not displayed]

FINDINGS: RIGHT jugular central venous catheter with tip projecting over SVC.

Upper normal heart size.

Mediastinal contours and pulmonary vascularity normal.

Lungs clear.

No pleural effusion or pneumothorax.

Bones unremarkable.
IMPRESSION: No pneumothorax following RIGHT jugular line placement.

## 2019-09-22 DEATH — deceased
